# Patient Record
Sex: Female | Born: 1935 | Race: White | Hispanic: No | Smoking: Never smoker
Health system: Southern US, Community
[De-identification: ages and names within clinical notes are randomized; demographics above are authoritative.]

## PROBLEM LIST (undated history)

## (undated) DIAGNOSIS — R197 Diarrhea, unspecified: Secondary | ICD-10-CM

## (undated) DIAGNOSIS — M179 Osteoarthritis of knee, unspecified: Secondary | ICD-10-CM

## (undated) DIAGNOSIS — R519 Headache, unspecified: Secondary | ICD-10-CM

## (undated) DIAGNOSIS — D3500 Benign neoplasm of unspecified adrenal gland: Secondary | ICD-10-CM

## (undated) DIAGNOSIS — M1710 Unilateral primary osteoarthritis, unspecified knee: Secondary | ICD-10-CM

## (undated) DIAGNOSIS — Z87442 Personal history of urinary calculi: Secondary | ICD-10-CM

## (undated) DIAGNOSIS — F329 Major depressive disorder, single episode, unspecified: Secondary | ICD-10-CM

## (undated) DIAGNOSIS — M2540 Effusion, unspecified joint: Secondary | ICD-10-CM

## (undated) DIAGNOSIS — I10 Essential (primary) hypertension: Secondary | ICD-10-CM

## (undated) DIAGNOSIS — IMO0001 Reserved for inherently not codable concepts without codable children: Secondary | ICD-10-CM

## (undated) DIAGNOSIS — F32A Depression, unspecified: Secondary | ICD-10-CM

## (undated) DIAGNOSIS — E0500 Thyrotoxicosis with diffuse goiter without thyrotoxic crisis or storm: Secondary | ICD-10-CM

## (undated) DIAGNOSIS — E785 Hyperlipidemia, unspecified: Secondary | ICD-10-CM

## (undated) DIAGNOSIS — L409 Psoriasis, unspecified: Secondary | ICD-10-CM

## (undated) DIAGNOSIS — I447 Left bundle-branch block, unspecified: Secondary | ICD-10-CM

## (undated) DIAGNOSIS — Z8619 Personal history of other infectious and parasitic diseases: Secondary | ICD-10-CM

## (undated) DIAGNOSIS — R51 Headache: Secondary | ICD-10-CM

## (undated) DIAGNOSIS — H9190 Unspecified hearing loss, unspecified ear: Secondary | ICD-10-CM

## (undated) DIAGNOSIS — M2550 Pain in unspecified joint: Secondary | ICD-10-CM

## (undated) HISTORY — PX: OTHER SURGICAL HISTORY: SHX169

## (undated) HISTORY — PX: CARDIAC CATHETERIZATION: SHX172

## (undated) HISTORY — DX: Unilateral primary osteoarthritis, unspecified knee: M17.10

## (undated) HISTORY — DX: Major depressive disorder, single episode, unspecified: F32.9

## (undated) HISTORY — PX: CHOLECYSTECTOMY: SHX55

## (undated) HISTORY — PX: KNEE ARTHROSCOPY W/ MENISCAL REPAIR: SHX1877

## (undated) HISTORY — DX: Osteoarthritis of knee, unspecified: M17.9

## (undated) HISTORY — PX: CATARACT EXTRACTION: SUR2

## (undated) HISTORY — DX: Depression, unspecified: F32.A

## (undated) HISTORY — PX: REPLACEMENT TOTAL KNEE: SUR1224

## (undated) HISTORY — DX: Thyrotoxicosis with diffuse goiter without thyrotoxic crisis or storm: E05.00

## (undated) HISTORY — DX: Benign neoplasm of unspecified adrenal gland: D35.00

---

## 1998-04-29 ENCOUNTER — Other Ambulatory Visit: Admission: RE | Admit: 1998-04-29 | Discharge: 1998-04-29 | Payer: Self-pay | Admitting: Internal Medicine

## 1999-09-07 ENCOUNTER — Other Ambulatory Visit: Admission: RE | Admit: 1999-09-07 | Discharge: 1999-09-07 | Payer: Self-pay | Admitting: Internal Medicine

## 2000-03-12 ENCOUNTER — Encounter: Admission: RE | Admit: 2000-03-12 | Discharge: 2000-03-12 | Payer: Self-pay | Admitting: Internal Medicine

## 2000-03-12 ENCOUNTER — Encounter: Payer: Self-pay | Admitting: Internal Medicine

## 2000-11-01 ENCOUNTER — Encounter: Payer: Self-pay | Admitting: Internal Medicine

## 2000-11-01 ENCOUNTER — Encounter: Admission: RE | Admit: 2000-11-01 | Discharge: 2000-11-01 | Payer: Self-pay | Admitting: Internal Medicine

## 2002-01-03 ENCOUNTER — Inpatient Hospital Stay (HOSPITAL_COMMUNITY): Admission: EM | Admit: 2002-01-03 | Discharge: 2002-01-06 | Payer: Self-pay | Admitting: Emergency Medicine

## 2002-01-03 ENCOUNTER — Encounter: Payer: Self-pay | Admitting: Internal Medicine

## 2002-01-03 ENCOUNTER — Encounter (INDEPENDENT_AMBULATORY_CARE_PROVIDER_SITE_OTHER): Payer: Self-pay | Admitting: Specialist

## 2002-01-04 ENCOUNTER — Encounter: Payer: Self-pay | Admitting: Internal Medicine

## 2002-01-06 ENCOUNTER — Encounter: Payer: Self-pay | Admitting: Internal Medicine

## 2002-01-28 ENCOUNTER — Ambulatory Visit (HOSPITAL_COMMUNITY): Admission: RE | Admit: 2002-01-28 | Discharge: 2002-01-28 | Payer: Self-pay | Admitting: Internal Medicine

## 2002-01-28 ENCOUNTER — Encounter: Payer: Self-pay | Admitting: Internal Medicine

## 2002-02-28 ENCOUNTER — Encounter: Payer: Self-pay | Admitting: Internal Medicine

## 2002-02-28 ENCOUNTER — Ambulatory Visit (HOSPITAL_COMMUNITY): Admission: RE | Admit: 2002-02-28 | Discharge: 2002-02-28 | Payer: Self-pay | Admitting: Internal Medicine

## 2002-05-12 ENCOUNTER — Encounter: Payer: Self-pay | Admitting: Internal Medicine

## 2002-05-12 ENCOUNTER — Ambulatory Visit (HOSPITAL_COMMUNITY): Admission: RE | Admit: 2002-05-12 | Discharge: 2002-05-12 | Payer: Self-pay | Admitting: Internal Medicine

## 2002-06-05 ENCOUNTER — Ambulatory Visit (HOSPITAL_COMMUNITY): Admission: RE | Admit: 2002-06-05 | Discharge: 2002-06-05 | Payer: Self-pay | Admitting: Internal Medicine

## 2002-06-05 ENCOUNTER — Encounter: Payer: Self-pay | Admitting: Internal Medicine

## 2002-10-10 ENCOUNTER — Encounter: Admission: RE | Admit: 2002-10-10 | Discharge: 2002-10-10 | Payer: Self-pay | Admitting: Internal Medicine

## 2002-10-10 ENCOUNTER — Encounter: Payer: Self-pay | Admitting: Internal Medicine

## 2003-08-14 ENCOUNTER — Encounter: Payer: Self-pay | Admitting: Internal Medicine

## 2003-08-14 ENCOUNTER — Ambulatory Visit (HOSPITAL_COMMUNITY): Admission: RE | Admit: 2003-08-14 | Discharge: 2003-08-14 | Payer: Self-pay | Admitting: Internal Medicine

## 2003-10-02 ENCOUNTER — Encounter: Admission: RE | Admit: 2003-10-02 | Discharge: 2003-10-02 | Payer: Self-pay | Admitting: Internal Medicine

## 2003-11-16 ENCOUNTER — Encounter: Admission: RE | Admit: 2003-11-16 | Discharge: 2003-11-16 | Payer: Self-pay | Admitting: Internal Medicine

## 2004-07-29 ENCOUNTER — Ambulatory Visit (HOSPITAL_COMMUNITY): Admission: RE | Admit: 2004-07-29 | Discharge: 2004-07-29 | Payer: Self-pay | Admitting: Internal Medicine

## 2005-01-25 ENCOUNTER — Encounter: Admission: RE | Admit: 2005-01-25 | Discharge: 2005-01-25 | Payer: Self-pay | Admitting: Internal Medicine

## 2006-03-12 ENCOUNTER — Encounter: Admission: RE | Admit: 2006-03-12 | Discharge: 2006-03-12 | Payer: Self-pay | Admitting: Internal Medicine

## 2006-10-22 ENCOUNTER — Other Ambulatory Visit: Admission: RE | Admit: 2006-10-22 | Discharge: 2006-10-22 | Payer: Self-pay | Admitting: *Deleted

## 2006-10-25 ENCOUNTER — Encounter: Admission: RE | Admit: 2006-10-25 | Discharge: 2006-10-25 | Payer: Self-pay

## 2007-04-26 ENCOUNTER — Encounter: Admission: RE | Admit: 2007-04-26 | Discharge: 2007-04-26 | Payer: Self-pay | Admitting: *Deleted

## 2008-04-28 ENCOUNTER — Encounter: Admission: RE | Admit: 2008-04-28 | Discharge: 2008-04-28 | Payer: Self-pay | Admitting: Internal Medicine

## 2009-04-29 ENCOUNTER — Encounter: Admission: RE | Admit: 2009-04-29 | Discharge: 2009-04-29 | Payer: Self-pay | Admitting: Internal Medicine

## 2010-09-30 ENCOUNTER — Inpatient Hospital Stay (HOSPITAL_COMMUNITY): Admission: RE | Admit: 2010-09-30 | Discharge: 2010-10-04 | Payer: Self-pay | Admitting: Orthopedic Surgery

## 2011-02-08 LAB — CBC
MCHC: 34.1 g/dL (ref 30.0–36.0)
MCV: 91.2 fL (ref 78.0–100.0)
MCV: 93 fL (ref 78.0–100.0)
MCV: 93.3 fL (ref 78.0–100.0)
Platelets: 208 10*3/uL (ref 150–400)
Platelets: 211 10*3/uL (ref 150–400)
RBC: 4.45 MIL/uL (ref 3.87–5.11)
RDW: 13.4 % (ref 11.5–15.5)
RDW: 13.8 % (ref 11.5–15.5)
RDW: 13.8 % (ref 11.5–15.5)
WBC: 12.2 10*3/uL — ABNORMAL HIGH (ref 4.0–10.5)
WBC: 7.9 10*3/uL (ref 4.0–10.5)

## 2011-02-08 LAB — BASIC METABOLIC PANEL
BUN: 6 mg/dL (ref 6–23)
Calcium: 7.8 mg/dL — ABNORMAL LOW (ref 8.4–10.5)
Chloride: 94 mEq/L — ABNORMAL LOW (ref 96–112)
Chloride: 95 mEq/L — ABNORMAL LOW (ref 96–112)
Chloride: 97 mEq/L (ref 96–112)
Creatinine, Ser: 0.63 mg/dL (ref 0.4–1.2)
Creatinine, Ser: 0.75 mg/dL (ref 0.4–1.2)
GFR calc Af Amer: >60 mL/min (ref >60)
GFR calc non Af Amer: >60 mL/min (ref >60)
GFR calc non Af Amer: >60 mL/min (ref >60)
Glucose, Bld: 182 mg/dL — ABNORMAL HIGH (ref 70–99)
Potassium: 3.3 mEq/L — ABNORMAL LOW (ref 3.5–5.1)
Sodium: 130 mEq/L — ABNORMAL LOW (ref 135–145)
Sodium: 134 mEq/L — ABNORMAL LOW (ref 135–145)
Sodium: 136 mEq/L (ref 135–145)

## 2011-02-08 LAB — COMPREHENSIVE METABOLIC PANEL
ALT: 12 U/L (ref 0–35)
CO2: 31 mEq/L (ref 19–32)
Chloride: 99 mEq/L (ref 96–112)
Creatinine, Ser: 0.68 mg/dL (ref 0.4–1.2)
GFR calc Af Amer: >60 mL/min (ref >60)
Sodium: 138 mEq/L (ref 135–145)
Total Protein: 6.8 g/dL (ref 6.0–8.3)

## 2011-02-08 LAB — TYPE AND SCREEN
ABO/RH(D): A NEG
Antibody Screen: NEGATIVE

## 2011-02-08 LAB — URINE MICROSCOPIC-ADD ON

## 2011-02-08 LAB — DIFFERENTIAL
Basophils Relative: 0 % (ref 0–1)
Monocytes Relative: 8 % (ref 3–12)
Neutro Abs: 5.1 10*3/uL (ref 1.7–7.7)
Neutrophils Relative %: 64 % (ref 43–77)

## 2011-02-08 LAB — URINALYSIS, ROUTINE W REFLEX MICROSCOPIC
Bilirubin Urine: NEGATIVE
Glucose, UA: NEGATIVE mg/dL
Specific Gravity, Urine: 1.011 (ref 1.005–1.030)
Urobilinogen, UA: 0.2 mg/dL (ref 0.0–1.0)
pH: 7 (ref 5.0–8.0)

## 2011-02-08 LAB — PROTIME-INR
INR: 0.91 (ref 0.00–1.49)
INR: 1.05 (ref 0.00–1.49)
INR: 1.14 (ref 0.00–1.49)
Prothrombin Time: 12.5 seconds (ref 11.6–15.2)
Prothrombin Time: 13.9 seconds (ref 11.6–15.2)
Prothrombin Time: 14.8 seconds (ref 11.6–15.2)
Prothrombin Time: 15.3 seconds — ABNORMAL HIGH (ref 11.6–15.2)

## 2011-02-08 LAB — SURGICAL PCR SCREEN: MRSA, PCR: NEGATIVE

## 2011-02-08 LAB — APTT: aPTT: 28 seconds (ref 24–37)

## 2011-04-14 NOTE — Op Note (Signed)
[place]. [facility]  Patient:    Christina Green, Christina Green Visit Number: [id number] MRN: [id number]          Service Type: MED Location: [phone] Attending Physician:  Madison Hickman Dictated by:   Lorne Skeens. Hoxworth, M.D. Proc. Date: 01/04/02 Admit Date:  01/03/2002                             Operative Report  PREOPERATIVE DIAGNOSIS:  Acute cholecystitis and cholelithiasis.  POSTOPERATIVE DIAGNOSIS:  Acute cholecystitis and cholelithiasis.  PROCEDURE:  Laparoscopic cholecystectomy.  SURGEON:  Lorne Skeens. Hoxworth, M.D.  ASSISTANT:  Currie Paris, M.D.  ANESTHESIA:  General.  BRIEF HISTORY:  [name] is a 77 year old white female who presented approximately 36 hours ago with acute epigastric and back pain.  MI was ruled out, and today she had a CT scan that shows marked thickening and edema of the gallbladder.  She has epigastric and right upper quadrant tenderness.  LFTs were normal.  Laparoscopic cholecystectomy has been recommended and accepted. The procedure, its indications, the risks of bleeding, infection, and bile duct injury were discussed and understood preoperatively.  She is now brought to the operating room for this procedure.  DESCRIPTION OF PROCEDURE:  The patient was brought to the operating room and placed in the supine position on the operating table, and general endotracheal anesthesia was induced.  She was already on broad-spectrum antibiotics.  The abdomen was sterilely prepped and draped.  PAS were in place.  Local anesthesia was used to infiltrate the trocar sites.  A 1 cm incision was made at the umbilicus and dissection carried down to the midline fascia, which was sharply incised for 1 cm and the peritoneum entered under direct vision.  A 2-0 mattress suture of 0 Vicryl, and the Hasson trocar was placed with pneumoperitoneum established.  There was a 1.5 cm exophytic skin lesion at the exact site of the  planned epigastric trocar.  This lesion was excised elliptically and trocar placed under direct vision in the epigastric area and two 5 mm trocars along the right subcostal margin.  There were obvious acute inflammatory changes in the right upper quadrant.  The omentum was adherent to the anterior abdominal wall with inflammatory adhesions, which were taken down bluntly.  The omentum was peeled back off the gallbladder, which was acutely inflamed with exudate.  The fundus was grasped.  The gallbladder was very friable and was opened at this point.  Gallstones and bile were suctioned clear.  Using blunt dissection, the infundibulum was exposed and was able to be retracted inferolaterally.  The distal gallbladder was very edematous.  The dissection was accomplished mostly with blunt dissection as the distal gallbladder was thoroughly dissected.  The cystic artery was seen coursing up onto the gallbladder wall, and it was divided between two proximal and one distal clip.  Then using blunt dissection, the distal gallbladder was dissected 360 degrees and seen to taper down to the cystic duct.  When the anatomy was clear, the cystic duct was doubly clipped proximally and clipped distally and divided.  The gallbladder was then dissected free from its bed using hook and spatula cautery and was placed in an Endocatch bag and brought out through the umbilical port.  The right upper quadrant was thoroughly irrigated.  Multiple gallstones were removed from Morisons pouch.  Hemostasis was obtained in the gallbladder bed with cautery and a Surgicel pack.  Two liters of irrigation were used until it was completely clear.  The site was inspected for hemostasis, which appeared complete.  A closed-suction drain was brought out through one of the lateral trocar sites and left in Morisons pouch.  The trocars were removed under direct vision and the pursestring suture secured at the umbilicus.  An additional  fascial suture was placed at the epigastric site.  Skin incisions were closed with staples.  Sponge, needle, and instrument counts were correct.  Dry sterile dressings were applied and the patient taken to recovery in good condition. Dictated by:   Lorne Skeens. Hoxworth, M.D. Attending Physician:  Madison Hickman DD:  01/04/02 TD:  01/06/02 Job: [id number]

## 2011-04-14 NOTE — Discharge Summary (Signed)
[place]. [facility]  Patient:    Christina Green, Christina Green Visit Number: [id number] MRN: [id number]          Service Type: MED Location: 5500 [phone] Attending Physician:  Madison Hickman Dictated by:   Darius Bump, M.D. Admit Date:  01/03/2002 Discharge Date: 01/06/2002   CC:         Christina Green, M.D.   Discharge Summary  ADMISSION DIAGNOSES: 1. Abdominal pain, uncertain etiology, with leukocytosis. 2. Hypertension. 3. Hyperlipidemia. 4. Osteopenia.  DISCHARGE DIAGNOSES: 1. Acute cholecystitis. 2. 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 3. Left ovarian cyst. 4. Hypertension. 5. Hyperlipidemia.  DISCHARGE MEDICATIONS: 1. Zocor 20 mg q.d. 2. Trandate 100 mg q.a.m. 200 mg q.h.s. 3. Ogen 0.625 mg q.d. 4. Provera 2.5 mg p.o. q.d. 5. Vicodin p.r.n. pain.  CONDITION ON DISCHARGE:  Improved.  HISTORY OF PRESENT ILLNESS:  Christina Green is an extremely pleasant 77 year old woman who was admitted with a 12 hour history of abdominal pain in the periumbilical and right upper quadrant area associated with nausea and vomiting.  She had not noted that she had any fever, possibly had some chills. She never had similar pain.  PAST MEDICAL HISTORY: 1. Hypertension. 2. Hyperlipidemia.  ADMISSION MEDICATIONS: 1. Zocor. 2. Trandate. 3. Ogen. 4. Provera.  ALLERGIES:  PENICILLIN.  ADMISSION PHYSICAL EXAMINATION:  GENERAL:  An ill-appearing woman whose temperature was 97, blood pressure 164/84, heart rate 72, with mild orthostatic changes.  Oxygen saturation was 98%.  HEENT:  Unremarkable.  NECK:  Unremarkable.  LUNGS:  Unremarkable.  HEART:  Unremarkable.  ABDOMEN:  Normal bowel sounds.  There is moderate epigastric and right upper quadrant tenderness without rebound or guarding.  NEUROLOGIC:  Nonfocal.  ADMISSION LABORATORY DATA:  White blood cell count 19.9, 92% polys, hemoglobin 14.2.  Normal liver  function tests, amylase, and lipase.  Normal electrolytes.  EKG had evidence of lateral strain which was unchanged from previous EKGs.  HOSPITAL COURSE:  The patient underwent a three-way of the abdomen on admission to the emergency room which was remarkable only for constipation. However, due to continued symptoms, she did undergo a CT which showed right upper quadrant inflammatory process with thickening of the gallbladder wall, raising the question of cholecystitis.  In addition, an adrenal mass and an ovarian cyst were found incidentally.  Dr. Johna Sheriff was consulted, and the patient underwent a laparoscopic cholecystectomy on 01/04/02.  She received two doses of Cipro perioperatively.  In terms of laboratories, her white count was reduced to 9.5 at the time of discharge.  Hemoglobin was at 10.5.  CMET remained normal.  Blood cultures were negative.  CONDITION ON DISCHARGE:  Improved.  The patient will require further evaluation of her incidental adrenal mass and ovarian cyst as an outpatient.  MRI was considered during hospitalization, but needed to be delayed due to staples present postoperatively. Dictated by:   Darius Bump, M.D. Attending Physician:  Madison Hickman DD:  01/22/02 TD:  01/22/02 Job: [id number]

## 2011-04-14 NOTE — H&P (Signed)
[place]. [facility]  Patient:    Christina Green, Christina Green Visit Number: [id number] MRN: [id number]          Service Type: MED Location: [phone] Attending Physician:  Madison Hickman Dictated by:   Darius Bump, M.D. Admit Date:  01/03/2002 Discharge Date: 01/06/2002                           History and Physical  CHIEF COMPLAINT:  Mrs. Basto is a 77 year old woman being admitted with epigastric pain, leukocytosis.  HISTORY OF PRESENT ILLNESS:  She is a very pleasant 77 year old woman who, last evening at rest, had acute onset of severe epigastric pain radiating to her back associated with nausea and vomiting.  She vomited six to eight times last night and is having continuing pain.  She has not had any diarrhea.  Last bowel movement was the day before yesterday.  She has not noted fever, may have had chills.  Feels a little short of breath when the pain is severe and maybe some slight pleuritic pain or pain with deep breath.  Pain is possibly improved with walking and also relieved with vomiting.  She has not had any hematemesis.  She describes the pain as sharp, excruciating, and sometimes feeling like pressure.  She has not had similar pain in the past.  PAST MEDICAL HISTORY: 1. Hypertension. 2. Hyperlipidemia. 3. Nephrolithiasis. 4. Onychomycosis. 5. Osteopenia. 6. Ventral hernia. 7. Atypical chest pain status post catheterization in 1995 that showed    nonspecific disease after a false positive stress test.  PAST SURGICAL HISTORY:  Knee surgery in 1996.  MEDICATIONS: 1. Zocor 20 mg q.d. 2. Trandate 100 mg in the morning, 200 mg q.h.s. 3. Ogen 0.625. 4. Provera 2.5.  ALLERGIES:  PENICILLIN - develops rash and swelling, apparently occurred in childhood.  FAMILY HISTORY:  Father with hypertension and CAD, mother with hypertension and CAD, sister with hypertension and recent cholecystitis.  SOCIAL HISTORY:  She is married.   Husband has Alzheimers disease.  Daughter and grandchildren live next door, are very involved.  No tobacco, rare alcohol.  REVIEW OF SYSTEMS:  As per HPI.  PHYSICAL EXAMINATION:  GENERAL:  An ill-appearing woman, no acute distress.  VITAL SIGNS:  Weight 127.  Temperature 97.  Blood pressure 164/84 with a heart rate of 72 lying down; 142/72 with a heart rate of 90 standing.  Saturation 98% on room air.  HEENT:  Pupils are equal, round, reactive to light.  Extraocular muscles are intact.  Oropharynx is clear.  NECK:  Without JVD.  Has symmetric pulses, no bruits heard.  LUNGS:  Clear.  HEART:  Regular rhythm and rate.  No murmur heard.  ABDOMEN:  Soft with normal bowel sounds.  She has moderate epigastric and right upper quadrant tenderness that is reproducible.  There is no rebound present.  She has no tenderness over her ventral hernia.  No lower quadrant tenderness.  RECTAL:  Deferred.  NEUROLOGIC:  Nonfocal.  LABORATORY DATA:  White count 19.9 with 92% polys, hemoglobin 14.2, platelet count 270.  LFTs reveal amylase and lipase are normal.  Sodium 139, potassium 3.5, chloride 100, bicarb 25, BUN 18, creatinine 0.8, glucose 125.  EKG shows normal sinus rhythm with left axis deviation and there is some slight ST depression in the lateral leads consistent with strain that had been seen on previous EKGs.  ASSESSMENT:  A 77 year old woman, new onset of severe abdominal  pain now ongoing for 14 hours, with leukocytosis of unclear etiology.  Initially by presentation favored cholecystitis but LFTs, amylase, and lipase are normal. Lung exam is normal.  Question whether she could have a partial small bowel obstruction.  PLAN:  Admit.  We are going to hydrate her.  Start with a three-way of the abdomen and probably proceed with an abdominal CT.  She is to be made n.p.o. We will obtain blood cultures.  I am not going to give her antibiotics until above studies are done.  She is  agreeable with plan.  We will place her on Lopressor IV for blood pressure as she is usually on a beta blocker and she will be n.p.o. Dictated by:   Darius Bump, M.D. Attending Physician:  Madison Hickman DD:  01/03/02 TD:  01/04/02 Job: [id number]

## 2012-09-26 ENCOUNTER — Other Ambulatory Visit (HOSPITAL_COMMUNITY): Payer: Self-pay | Admitting: Diagnostic Radiology

## 2012-09-26 DIAGNOSIS — N281 Cyst of kidney, acquired: Secondary | ICD-10-CM

## 2012-10-07 ENCOUNTER — Other Ambulatory Visit: Payer: Self-pay | Admitting: Internal Medicine

## 2012-10-07 ENCOUNTER — Ambulatory Visit
Admission: RE | Admit: 2012-10-07 | Discharge: 2012-10-07 | Disposition: A | Discharge status: Home / Self Care | Payer: 59 | Source: Ambulatory Visit | Attending: Internal Medicine | Admitting: Internal Medicine

## 2012-10-07 DIAGNOSIS — S0990XA Unspecified injury of head, initial encounter: Secondary | ICD-10-CM

## 2012-10-11 ENCOUNTER — Ambulatory Visit (HOSPITAL_COMMUNITY)
Admission: RE | Admit: 2012-10-11 | Discharge: 2012-10-11 | Disposition: A | Discharge status: Home / Self Care | Payer: Medicare Other | Source: Ambulatory Visit | Attending: Diagnostic Radiology | Admitting: Diagnostic Radiology

## 2012-10-11 DIAGNOSIS — N281 Cyst of kidney, acquired: Secondary | ICD-10-CM

## 2012-10-11 DIAGNOSIS — Q619 Cystic kidney disease, unspecified: Secondary | ICD-10-CM | POA: Insufficient documentation

## 2012-10-11 MED ORDER — GADOBENATE DIMEGLUMINE 529 MG/ML IV SOLN
12.0000 mL | Freq: Once | INTRAVENOUS | Status: AC | PRN
  Administered 2012-10-11: 12 mL via INTRAVENOUS

## 2012-10-12 ENCOUNTER — Ambulatory Visit (HOSPITAL_COMMUNITY): Admission: RE | Admit: 2012-10-12 | Payer: 59 | Source: Ambulatory Visit

## 2012-10-14 LAB — POCT I-STAT, CHEM 8
Calcium, Ion: 1.25 mmol/L (ref 1.13–1.30)
Chloride: 102 mEq/L (ref 96–112)
Glucose, Bld: 93 mg/dL (ref 70–99)
HCT: 38 % (ref 36.0–46.0)
Hemoglobin: 12.9 g/dL (ref 12.0–15.0)
TCO2: 26 mmol/L (ref 0–100)

## 2013-06-25 ENCOUNTER — Emergency Department (HOSPITAL_COMMUNITY): Payer: Medicare Other

## 2013-06-25 ENCOUNTER — Emergency Department (HOSPITAL_COMMUNITY)
Admission: EM | Admit: 2013-06-25 | Discharge: 2013-06-25 | Disposition: A | Discharge status: Home / Self Care | Payer: Medicare Other | Attending: Emergency Medicine | Admitting: Emergency Medicine

## 2013-06-25 ENCOUNTER — Encounter (HOSPITAL_COMMUNITY): Payer: Self-pay | Admitting: Radiology

## 2013-06-25 DIAGNOSIS — Z8679 Personal history of other diseases of the circulatory system: Secondary | ICD-10-CM | POA: Insufficient documentation

## 2013-06-25 DIAGNOSIS — I10 Essential (primary) hypertension: Secondary | ICD-10-CM | POA: Insufficient documentation

## 2013-06-25 DIAGNOSIS — M542 Cervicalgia: Secondary | ICD-10-CM | POA: Insufficient documentation

## 2013-06-25 DIAGNOSIS — E785 Hyperlipidemia, unspecified: Secondary | ICD-10-CM | POA: Insufficient documentation

## 2013-06-25 DIAGNOSIS — R42 Dizziness and giddiness: Secondary | ICD-10-CM | POA: Insufficient documentation

## 2013-06-25 DIAGNOSIS — Z79899 Other long term (current) drug therapy: Secondary | ICD-10-CM | POA: Insufficient documentation

## 2013-06-25 HISTORY — DX: Headache, unspecified: R51.9

## 2013-06-25 HISTORY — DX: Hyperlipidemia, unspecified: E78.5

## 2013-06-25 HISTORY — DX: Headache: R51

## 2013-06-25 HISTORY — DX: Essential (primary) hypertension: I10

## 2013-06-25 LAB — URINE MICROSCOPIC-ADD ON

## 2013-06-25 LAB — COMPREHENSIVE METABOLIC PANEL
ALT: 9 U/L (ref 0–35)
AST: 17 U/L (ref 0–37)
Albumin: 3.7 g/dL (ref 3.5–5.2)
Alkaline Phosphatase: 78 U/L (ref 39–117)
BUN: 15 mg/dL (ref 6–23)
CO2: 25 mEq/L (ref 19–32)
Calcium: 9.9 mg/dL (ref 8.4–10.5)
Chloride: 100 mEq/L (ref 96–112)
Creatinine, Ser: 0.66 mg/dL (ref 0.50–1.10)
GFR calc Af Amer: >90 mL/min (ref >90)
GFR calc non Af Amer: 79 mL/min — ABNORMAL LOW (ref >90)
Glucose, Bld: 136 mg/dL — ABNORMAL HIGH (ref 70–99)
Potassium: 4 mEq/L (ref 3.5–5.1)
Sodium: 136 mEq/L (ref 135–145)
Total Bilirubin: 0.5 mg/dL (ref 0.3–1.2)
Total Protein: 6.6 g/dL (ref 6.0–8.3)

## 2013-06-25 LAB — URINALYSIS, ROUTINE W REFLEX MICROSCOPIC
Glucose, UA: NEGATIVE mg/dL
Ketones, ur: NEGATIVE mg/dL
Protein, ur: NEGATIVE mg/dL
pH: 7 (ref 5.0–8.0)

## 2013-06-25 LAB — CBC
MCHC: 34.1 g/dL (ref 30.0–36.0)
Platelets: 240 10*3/uL (ref 150–400)
RDW: 13.8 % (ref 11.5–15.5)
WBC: 6.6 10*3/uL (ref 4.0–10.5)

## 2013-06-25 MED ORDER — MECLIZINE HCL 25 MG PO TABS
12.5000 mg | ORAL_TABLET | Freq: Once | ORAL | Status: AC
  Administered 2013-06-25: 12.5 mg via ORAL
  Filled 2013-06-25: qty 1

## 2013-06-25 MED ORDER — MECLIZINE HCL 12.5 MG PO TABS: 25.0000 mg | ORAL_TABLET | Freq: Three times a day (TID) | ORAL | Status: DC | PRN

## 2013-06-25 MED ORDER — ACETAMINOPHEN 325 MG PO TABS
650.0000 mg | ORAL_TABLET | Freq: Once | ORAL | Status: AC
  Administered 2013-06-25: 650 mg via ORAL
  Filled 2013-06-25: qty 2

## 2013-06-25 MED ORDER — TRAMADOL HCL 50 MG PO TABS: 50.0000 mg | ORAL_TABLET | Freq: Three times a day (TID) | ORAL | Status: DC | PRN

## 2013-06-25 NOTE — ED Provider Notes (Signed)
CSN: [id number]     Arrival date & time 06/25/13  0758 History     First MD Initiated Contact with Patient 06/25/13 [phone]     Chief Complaint  Patient presents with  . Dizziness   (Consider location/radiation/quality/duration/timing/severity/associated sxs/prior Treatment) The history is provided by the patient.  pt w hx htn, c/o feeling dizzy, room spinning sensation in past day. Gradual onset, constant. Mild-mod. Denies specific exacerbating or alleviating factors. No change in speech or vision. No numbness/weakness. No problems w balance or coordination. Notes hx dizziness/lightheadedness upon standing for past year, but states this feels different. Also notes hx intermittent headaches, but denies any acute or abrupt change in headaches or severe head pain. No sinus congestion or allergy symptoms. No hearing loss or tinnitus. No injury/trauma. Denies recent change in meds or new meds. No fever or chills.   Past Medical History  Diagnosis Date  . Hypertension   . Hyperlipemia   . Headache    Past Surgical History  Procedure Laterality Date  . Cholecystectomy    . Cataract extraction    . Replacement total knee     History reviewed. No pertinent family history. History  Substance Use Topics  . Smoking status: Not on file  . Smokeless tobacco: Not on file  . Alcohol Use: Not on file   OB History   Grav Para Term Preterm Abortions TAB SAB Ect Mult Living                 Review of Systems  Constitutional: Negative for fever and chills.  HENT: Negative for neck pain.   Eyes: Negative for redness.  Respiratory: Negative for cough and shortness of breath.   Cardiovascular: Negative for chest pain and palpitations.  Gastrointestinal: Negative for abdominal pain.  Genitourinary: Negative for flank pain.  Musculoskeletal: Negative for back pain.  Skin: Negative for rash.  Neurological: Positive for dizziness.  Hematological: Does not bruise/bleed easily.   Psychiatric/Behavioral: Negative for confusion.    Allergies  Penicillins  Home Medications  No current outpatient prescriptions on file. BP 203/88  Pulse 54  Temp(Src) 97.8 F (36.6 C) (Oral)  Resp 18  Ht 4\' 11"  (1.499 m)  Wt 130 lb (58.968 kg)  BMI 26.24 kg/m2  SpO2 93% Physical Exam  Nursing note and vitals reviewed. Constitutional: She is oriented to person, place, and time. She appears well-developed and well-nourished. No distress.  HENT:  Head: Atraumatic.  tms normal.   Eyes: Conjunctivae and EOM are normal. Pupils are equal, round, and reactive to light. No scleral icterus.  Neck: Neck supple. No tracheal deviation present.  No bruit  Cardiovascular: Normal rate, regular rhythm, normal heart sounds and intact distal pulses.   Pulmonary/Chest: Effort normal and breath sounds normal. No respiratory distress.  Abdominal: Soft. Normal appearance. She exhibits no distension. There is no tenderness.  Genitourinary:  No cva tenderness  Musculoskeletal: She exhibits no edema and no tenderness.  Neurological: She is alert and oriented to person, place, and time. No cranial nerve deficit.  Romberg unremarkable. Steady gait   Skin: Skin is warm and dry. No rash noted.  Psychiatric: She has a normal mood and affect.    ED Course   Procedures (including critical care time)  Results for orders placed during the hospital encounter of 06/25/13  CBC      Result Value Range   WBC 6.6  4.0 - 10.5 K/uL   RBC 4.46  3.87 - 5.11 MIL/uL   Hemoglobin 13.6  12.0 - 15.0 g/dL   HCT 40.9  81.1 - 91.4 %   MCV 89.5  78.0 - 100.0 fL   MCH 30.5  26.0 - 34.0 pg   MCHC 34.1  30.0 - 36.0 g/dL   RDW 78.2  95.6 - 21.3 %   Platelets 240  150 - 400 K/uL  COMPREHENSIVE METABOLIC PANEL      Result Value Range   Sodium 136  135 - 145 mEq/L   Potassium 4.0  3.5 - 5.1 mEq/L   Chloride 100  96 - 112 mEq/L   CO2 25  19 - 32 mEq/L   Glucose, Bld 136 (*) 70 - 99 mg/dL   BUN 15  6 - 23 mg/dL    Creatinine, Ser 0.86  0.50 - 1.10 mg/dL   Calcium 9.9  8.4 - 57.8 mg/dL   Total Protein 6.6  6.0 - 8.3 g/dL   Albumin 3.7  3.5 - 5.2 g/dL   AST 17  0 - 37 U/L   ALT 9  0 - 35 U/L   Alkaline Phosphatase 78  39 - 117 U/L   Total Bilirubin 0.5  0.3 - 1.2 mg/dL   GFR calc non Af Amer 79 (*) >90 mL/min   GFR calc Af Amer >90  >90 mL/min  URINALYSIS, ROUTINE W REFLEX MICROSCOPIC      Result Value Range   Color, Urine YELLOW  YELLOW   APPearance CLEAR  CLEAR   Specific Gravity, Urine 1.013  1.005 - 1.030   pH 7.0  5.0 - 8.0   Glucose, UA NEGATIVE  NEGATIVE mg/dL   Hgb urine dipstick SMALL (*) NEGATIVE   Bilirubin Urine NEGATIVE  NEGATIVE   Ketones, ur NEGATIVE  NEGATIVE mg/dL   Protein, ur NEGATIVE  NEGATIVE mg/dL   Urobilinogen, UA 0.2  0.0 - 1.0 mg/dL   Nitrite NEGATIVE  NEGATIVE   Leukocytes, UA SMALL (*) NEGATIVE  URINE MICROSCOPIC-ADD ON      Result Value Range   Squamous Epithelial / LPF MANY (*) RARE   WBC, UA 3-6  <3 WBC/hpf   RBC / HPF 3-6  <3 RBC/hpf   Bacteria, UA RARE  RARE   Ct Head Wo Contrast  06/25/2013   *RADIOLOGY REPORT*  Clinical Data: Dizziness, pain, head injury 8 months ago  CT HEAD WITHOUT CONTRAST  Technique:  Contiguous axial images were obtained from the base of the skull through the vertex without contrast.  Comparison:   10/07/2012  Findings: No skull fracture is noted. Paranasal sinuses and mastoid air cells are unremarkable.  No intracranial hemorrhage, mass effect or midline shift.  Stable cerebral atrophy.  Periventricular and patchy subcortical white matter decreased attenuation consistent with chronic small vessel ischemic changes stable from prior exam.  Atherosclerotic calcifications of carotid siphon are again noted. Atherosclerotic calcifications of the vertebral and basilar artery. No acute cortical infarction.  No mass lesion is noted on this unenhanced scan.  IMPRESSION: No acute intracranial abnormality.  Stable atrophy and chronic white matter  disease.  No definite acute cortical infarction.   Original Report Authenticated By: Natasha Mead, M.D.   Ct Cervical Spine Wo Contrast  06/25/2013   *RADIOLOGY REPORT*  Clinical Data: Pain, recent fall  CT CERVICAL SPINE WITHOUT CONTRAST  Technique:  Multidetector CT imaging of the cervical spine was performed. Multiplanar CT image reconstructions were also generated.  Comparison: None.  Findings: Axial images of the cervical spine shows no acute fracture or subluxation.  There is no  pneumothorax in visualized lung apices.  No prevertebral soft tissue swelling.  Cervical airway is patent.  Degenerative changes are noted C1-C2 articulation.  Computer processed images shows no acute fracture.  There is mild disc space flattening at C2-C3 level.  There is disc space flattening with anterior and posterior spurring at C3-C4 level.  There is mild narrowing of the left lateral recess due to posterior spurring at C3-C4 level.  There is disc space flattening with mild anterior and mild to moderate posterior spurring and endplate sclerotic changes at C4-C5 level.  Moderate spinal canal stenosis due to posterior spurring at this level.  There is disc space flattening with anterior and posterior spurring at C5-C6 level.  Moderate spinal canal stenosis due to posterior spurring at this level.  There is significant disc space flattening with mild anterior and mild posterior spurring.  Mild spinal canal stenosis due to posterior spurring at this level.  There is about 2 mm anterolisthesis C7 on T1 vertebral body probable chronic in nature.  IMPRESSION: No acute fracture is noted.  Multilevel degenerative changes as described above.  Multilevel spinal canal stenosis due to posterior spurring as described above.  Probable chronic mild anterolisthesis C7 on T1 vertebral body.  No prevertebral soft tissue swelling. Cervical airway is patent.   Original Report Authenticated By: Natasha Mead, M.D.      MDM  antivert po.  Labs.  Reviewed nursing notes and prior charts for additional history.    Date: 06/25/2013  Rate: 54  Rhythm: sinus bradycardia  QRS Axis: left  Intervals: normal  ST/T Wave abnormalities: nonspecific ST/T changes  Conduction Disutrbances:left bundle branch block  Narrative Interpretation:   Old EKG Reviewed: none available   Pt much improved w antivert.   Ambulatory. No faintness or dizziness.   Pt requests ct neck as has had problems w arthritis, neck pain that have been getting worse. Ct done. Deg changes noted, neg acute. Will have f/u pcp.  Discussed need close pcp follow up re relative bradycardia, long standing lightheadedness when standing, dizziness, chronic neck pain/deg dis.   Pt appears stable for d/c from ed.     Suzi Roots, MD 06/25/13 1329

## 2013-06-25 NOTE — ED Notes (Signed)
Family at bedside. 

## 2013-06-25 NOTE — ED Notes (Signed)
Hooked pt back up to the monitor after returning from CT scan. 

## 2013-06-25 NOTE — ED Notes (Signed)
Ambulate pt. In the hallway .assit pt.back in the room and back to bed with nurse assitance. Pt.states she was very dizzie.

## 2013-06-25 NOTE — ED Notes (Signed)
Pt has been seen at [facility] for s/s. Pt states that today's dizziness is different. Condition is chronic in nature. Condition is made worse by "using my eyes" condition is made better by "itself"

## 2013-06-25 NOTE — ED Notes (Signed)
MD at bedside. 

## 2013-06-25 NOTE — ED Notes (Signed)
Unhooked pt to go to CT scan

## 2013-06-25 NOTE — ED Notes (Signed)
Patient transported to CT 

## 2013-06-25 NOTE — ED Notes (Signed)
Pt presents with persistent dizziness since 0645 this morning.

## 2014-04-22 ENCOUNTER — Encounter (HOSPITAL_COMMUNITY): Payer: Self-pay | Admitting: Emergency Medicine

## 2014-04-22 ENCOUNTER — Emergency Department (INDEPENDENT_AMBULATORY_CARE_PROVIDER_SITE_OTHER)
Admission: EM | Admit: 2014-04-22 | Discharge: 2014-04-22 | Disposition: A | Discharge status: Home / Self Care | Payer: 59 | Source: Home / Self Care | Attending: Family Medicine | Admitting: Family Medicine

## 2014-04-22 DIAGNOSIS — Y93H2 Activity, gardening and landscaping: Secondary | ICD-10-CM

## 2014-04-22 DIAGNOSIS — R296 Repeated falls: Secondary | ICD-10-CM

## 2014-04-22 DIAGNOSIS — IMO0002 Reserved for concepts with insufficient information to code with codable children: Secondary | ICD-10-CM

## 2014-04-22 DIAGNOSIS — S0081XA Abrasion of other part of head, initial encounter: Secondary | ICD-10-CM

## 2014-04-22 MED ORDER — TETANUS-DIPHTH-ACELL PERTUSSIS 5-2.5-18.5 LF-MCG/0.5 IM SUSP
INTRAMUSCULAR | Status: AC
  Filled 2014-04-22: qty 0.5

## 2014-04-22 MED ORDER — TETANUS-DIPHTH-ACELL PERTUSSIS 5-2.5-18.5 LF-MCG/0.5 IM SUSP
0.5000 mL | Freq: Once | INTRAMUSCULAR | Status: AC
  Administered 2014-04-22: 0.5 mL via INTRAMUSCULAR

## 2014-04-22 NOTE — ED Notes (Signed)
Pt  Reports  He  [address]    Today  Striking  Her  Head   She  Did  Not black out  She  Remembers  Everything          She  Is  Awake  And  Alert  And  Oriented  Her  Skin is  Warm  And  Dry  She   denys  Any  Vomiting      She  Has  An abrasion to  Her  Forehead

## 2014-04-22 NOTE — ED Provider Notes (Signed)
CSN: [id number]     Arrival date & time 04/22/14  1706 History   First MD Initiated Contact with Patient 04/22/14 1758     Chief Complaint  Patient presents with  . Fall   (Consider location/radiation/quality/duration/timing/severity/associated sxs/prior Treatment) Patient is a 80 y.o. female presenting with fall. The history is provided by the patient. No language interpreter was used.  Fall This is a new problem. The current episode started 1 to 2 hours ago. The problem occurs constantly. The problem has not changed since onset.Pertinent negatives include no headaches and no shortness of breath. Nothing aggravates the symptoms. Nothing relieves the symptoms. She has tried nothing for the symptoms. The treatment provided no relief.   Pt reports she fell while working in garden.  Pt scraped forehead on table as she fell.  Pt reports head did not hit ground.  No loss of consciousness.   Pt denies any neurologic sign. Pt reports hands feel sore.  Pt reports she hit her knee.  Pt does not think she broke anything.   Pt able to walk without difficulty Past Medical History  Diagnosis Date  . Hypertension   . Hyperlipemia   . Headache    Past Surgical History  Procedure Laterality Date  . Cholecystectomy    . Cataract extraction    . Replacement total knee     History reviewed. No pertinent family history. History  Substance Use Topics  . Smoking status: Not on file  . Smokeless tobacco: Not on file  . Alcohol Use: Not on file   OB History   Grav Para Term Preterm Abortions TAB SAB Ect Mult Living                 Review of Systems  Respiratory: Negative for shortness of breath.   Neurological: Negative for headaches.  All other systems reviewed and are negative.   Allergies  Penicillins  Home Medications   Prior to Admission medications   Medication Sig Start Date End Date Taking? Authorizing Provider  DULoxetine HCl (CYMBALTA PO) Take 1 tablet by mouth every morning.     Historical Provider, MD  ibuprofen (ADVIL,MOTRIN) 200 MG tablet Take 200 mg by mouth daily as needed for pain or headache.    Historical Provider, MD  losartan-hydrochlorothiazide (HYZAAR) 50-12.5 MG per tablet Take 1 tablet by mouth every morning.    Historical Provider, MD  meclizine (ANTIVERT) 12.5 MG tablet Take 2 tablets (25 mg total) by mouth 3 (three) times daily as needed for dizziness. 06/25/13   Mirna Mires, MD  Omega-3 Fatty Acids (FISH OIL PO) Take 1 capsule by mouth daily.    Historical Provider, MD  PRESCRIPTION MEDICATION Take 2 tablets by mouth 2 (two) times daily. Blood pressure medication    Historical Provider, MD  PRESCRIPTION MEDICATION Take 1 tablet by mouth daily. Cholesterol medication    Historical Provider, MD  traMADol (ULTRAM) 50 MG tablet Take 1 tablet (50 mg total) by mouth every 8 (eight) hours as needed for pain. 06/25/13   Mirna Mires, MD   BP 185/85  Pulse 62  Temp(Src) 98.3 F (36.8 C) (Oral)  Resp 18  SpO2 95% Physical Exam  Constitutional: She appears well-developed and well-nourished.  HENT:  Head: Normocephalic.  Right Ear: External ear normal.  Left Ear: External ear normal.  Nose: Nose normal.  Mouth/Throat: Oropharynx is clear and moist.  Abrasion forehead,  Small area of bruising forehead  Eyes: Pupils are equal, round, and reactive to  light.  Neck: Normal range of motion.  Cardiovascular: Normal rate.   Pulmonary/Chest: Effort normal.  Abdominal: Soft.  Musculoskeletal: She exhibits tenderness (bruised left knee  from,  from wisst and arms. ).  Neurological: She is alert.  Skin: Skin is warm.  Psychiatric: She has a normal mood and affect.    ED Course  Procedures (including critical care time) Labs Review Labs Reviewed - No data to display  Imaging Review No results found.   MDM   1. Abrasion of forehead    I doubt head injury,  Mechanism sounds more like an abrasion from table as she fell.  Pt does not feel she has  anything broken,  xrays declined.   I discussed neurologic symptoms with family that wound indicate that pt needs a ct.   Pt agrees to return f any changes.   I advised she can return for xray if any concerns    Fransico Meadow, Hershal Coria 04/22/14 1830   Medical screening examination/treatment/procedure(s) were performed by a resident physician or non-physician practitioner and as the supervising physician I was immediately available for consultation/collaboration.  Lynne Leader, MD    Gregor Hams, MD 04/23/14 [phone]

## 2014-04-22 NOTE — Discharge Instructions (Signed)
Abrasion An abrasion is a cut or scrape of the skin. Abrasions do not extend through all layers of the skin and most heal within 10 days. It is important to care for your abrasion properly to prevent infection. CAUSES  Most abrasions are caused by falling on, or gliding across, the ground or other surface. When your skin rubs on something, the outer and inner layer of skin rubs off, causing an abrasion. DIAGNOSIS  Your caregiver will be able to diagnose an abrasion during a physical exam.  TREATMENT  Your treatment depends on how large and deep the abrasion is. Generally, your abrasion will be cleaned with water and a mild soap to remove any dirt or debris. An antibiotic ointment may be put over the abrasion to prevent an infection. A bandage (dressing) may be wrapped around the abrasion to keep it from getting dirty.  You may need a tetanus shot if:  You cannot remember when you had your last tetanus shot.  You have never had a tetanus shot.  The injury broke your skin. If you get a tetanus shot, your arm may swell, get red, and feel warm to the touch. This is common and not a problem. If you need a tetanus shot and you choose not to have one, there is a rare chance of getting tetanus. Sickness from tetanus can be serious.  HOME CARE INSTRUCTIONS   If a dressing was applied, change it at least once a day or as directed by your caregiver. If the bandage sticks, soak it off with warm water.   Wash the area with water and a mild soap to remove all the ointment 2 times a day. Rinse off the soap and pat the area dry with a clean towel.   Reapply any ointment as directed by your caregiver. This will help prevent infection and keep the bandage from sticking. Use gauze over the wound and under the dressing to help keep the bandage from sticking.   Change your dressing right away if it becomes wet or dirty.   Only take over-the-counter or prescription medicines for pain, discomfort, or fever as  directed by your caregiver.   Follow up with your caregiver within 24 48 hours for a wound check, or as directed. If you were not given a wound-check appointment, look closely at your abrasion for redness, swelling, or pus. These are signs of infection. SEEK IMMEDIATE MEDICAL CARE IF:   You have increasing pain in the wound.   You have redness, swelling, or tenderness around the wound.   You have pus coming from the wound.   You have a fever or persistent symptoms for more than 2 3 days.  You have a fever and your symptoms suddenly get worse.  You have a bad smell coming from the wound or dressing.  MAKE SURE YOU:   Understand these instructions.  Will watch your condition.  Will get help right away if you are not doing well or get worse. Document Released: 08/23/2005 Document Revised: 10/30/2012 Document Reviewed: 10/17/2011 [facility] Patient Information 2014 [place], [place].  Head Injury, Adult You have received a head injury. It does not appear serious at this time. Headaches and vomiting are common following head injury. It should be easy to awaken from sleeping. Sometimes it is necessary for you to stay in the emergency department for a while for observation. Sometimes admission to the hospital may be needed. After injuries such as yours, most problems occur within the first 24 hours, but  side effects may occur up to 7 10 days after the injury. It is important for you to carefully monitor your condition and contact your health care provider or seek immediate medical care if there is a change in your condition. WHAT ARE THE TYPES OF HEAD INJURIES? Head injuries can be as minor as a bump. Some head injuries can be more severe. More severe head injuries include:  A jarring injury to the brain (concussion).  A bruise of the brain (contusion). This mean there is bleeding in the brain that can cause swelling.  A cracked skull (skull fracture).  Bleeding in the brain that  collects, clots, and forms a bump (hematoma). WHAT CAUSES A HEAD INJURY? A serious head injury is most likely to happen to someone who is in a car wreck and is not wearing a seat belt. Other causes of major head injuries include bicycle or motorcycle accidents, sports injuries, and falls. HOW ARE HEAD INJURIES DIAGNOSED? A complete history of the event leading to the injury and your current symptoms will be helpful in diagnosing head injuries. Many times, pictures of the brain, such as CT or MRI are needed to see the extent of the injury. Often, an overnight hospital stay is necessary for observation.  WHEN SHOULD I SEEK IMMEDIATE MEDICAL CARE?  You should get help right away if:  You have confusion or drowsiness.  You feel sick to your stomach (nauseous) or have continued, forceful vomiting.  You have dizziness or unsteadiness that is getting worse.  You have severe, continued headaches not relieved by medicine. Only take over-the-counter or prescription medicines for pain, fever, or discomfort as directed by your health care provider.  You do not have normal function of the arms or legs or are unable to walk.  You notice changes in the black spots in the center of the colored part of your eye (pupil).  You have a clear or bloody fluid coming from your nose or ears.  You have a loss of vision. During the next 24 hours after the injury, you must stay with someone who can watch you for the warning signs. This person should contact local emergency services (911 in the [place] if you have seizures, you become unconscious, or you are unable to wake up. HOW CAN I PREVENT A HEAD INJURY IN THE FUTURE? The most important factor for preventing major head injuries is avoiding motor vehicle accidents. To minimize the potential for damage to your head, it is crucial to wear seat belts while riding in motor vehicles. Wearing helmets while bike riding and playing collision sports (like football) is also  helpful. Also, avoiding dangerous activities around the house will further help reduce your risk of head injury.  WHEN CAN I RETURN TO NORMAL ACTIVITIES AND ATHLETICS? You should be reevaluated by your health care provider before returning to these activities. If you have any of the following symptoms, you should not return to activities or contact sports until 1 week after the symptoms have stopped:  Persistent headache.  Dizziness or vertigo.  Poor attention and concentration.  Confusion.  Memory problems.  Nausea or vomiting.  Fatigue or tire easily.  Irritability.  Intolerant of bright lights or loud noises.  Anxiety or depression.  Disturbed sleep. MAKE SURE YOU:   Understand these instructions.  Will watch your condition.  Will get help right away if you are not doing well or get worse. Document Released: 11/13/2005 Document Revised: 09/03/2013 Document Reviewed: 07/21/2013 [employer] Patient Information 2014  [employer] Contusion A contusion is a deep bruise. Contusions are the result of an injury that caused bleeding under the skin. The contusion may turn blue, purple, or yellow. Minor injuries will give you a painless contusion, but more severe contusions may stay painful and swollen for a few weeks.  CAUSES  A contusion is usually caused by a blow, trauma, or direct force to an area of the body. SYMPTOMS   Swelling and redness of the injured area.  Bruising of the injured area.  Tenderness and soreness of the injured area.  Pain. DIAGNOSIS  The diagnosis can be made by taking a history and physical exam. An X-ray, CT scan, or MRI may be needed to determine if there were any associated injuries, such as fractures. TREATMENT  Specific treatment will depend on what area of the body was injured. In general, the best treatment for a contusion is resting, icing, elevating, and applying cold compresses to the injured area. Over-the-counter medicines may also be  recommended for pain control. Ask your caregiver what the best treatment is for your contusion. HOME CARE INSTRUCTIONS   Put ice on the injured area.  Put ice in a plastic bag.  Place a towel between your skin and the bag.  Leave the ice on for 15-20 minutes, 03-04 times a day.  Only take over-the-counter or prescription medicines for pain, discomfort, or fever as directed by your caregiver. Your caregiver may recommend avoiding anti-inflammatory medicines (aspirin, ibuprofen, and naproxen) for 48 hours because these medicines may increase bruising.  Rest the injured area.  If possible, elevate the injured area to reduce swelling. SEEK IMMEDIATE MEDICAL CARE IF:   You have increased bruising or swelling.  You have pain that is getting worse.  Your swelling or pain is not relieved with medicines. MAKE SURE YOU:   Understand these instructions.  Will watch your condition.  Will get help right away if you are not doing well or get worse. Document Released: 08/23/2005 Document Revised: 02/05/2012 Document Reviewed: 09/18/2011 [facility] Patient Information 2014 [place], [place].

## 2014-08-26 ENCOUNTER — Other Ambulatory Visit: Payer: Self-pay | Admitting: Internal Medicine

## 2014-08-26 ENCOUNTER — Ambulatory Visit
Admission: RE | Admit: 2014-08-26 | Discharge: 2014-08-26 | Disposition: A | Discharge status: Home / Self Care | Payer: Medicare Other | Source: Ambulatory Visit | Attending: Internal Medicine | Admitting: Internal Medicine

## 2014-08-26 DIAGNOSIS — R42 Dizziness and giddiness: Secondary | ICD-10-CM

## 2015-12-30 ENCOUNTER — Other Ambulatory Visit: Payer: Self-pay | Admitting: Orthopedic Surgery

## 2016-01-18 NOTE — Pre-Procedure Instructions (Signed)
Christina Green  01/18/2016      [id number] [address] [address] Phone: [phone] Fax: [phone]  [facility] DELIVERY - [place], [place] [address] [place] [address] Phone: [phone] Fax: [phone]    Your procedure is scheduled on Mon, Mar 6 @ 9:00 AM  Report to [facility] Admitting at 7:00 AM  Call this number if you have problems the morning of surgery:  [phone]   Remember:  Do not eat food or drink liquids after midnight.  Take these medicines the morning of surgery with A SIP OF WATER Duloxetine(Cymbalta) and Labetalol(Normodyne)              Stop taking your Ibuprofen and Fish Oil. No Goody's,BC's,Aleve,Aspirin,Advil,Motrin,or any Herbal Medications.    Do not wear jewelry, make-up or nail polish.  Do not wear lotions, powders, or perfumes.  You may wear deodorant.  Do not shave 48 hours prior to surgery.   Do not bring valuables to the hospital.  [facility] is not responsible for any belongings or valuables.  Contacts, dentures or bridgework may not be worn into surgery.  Leave your suitcase in the car.  After surgery it may be brought to your room.  For patients admitted to the hospital, discharge time will be determined by your treatment team.  Patients discharged the day of surgery will not be allowed to drive home.    Special instructions:  [facility] - Preparing for Surgery  Before surgery, you can play an important role.  Because skin is not sterile, your skin needs to be as free of germs as possible.  You can reduce the number of germs on you skin by washing with CHG (chlorahexidine gluconate) soap before surgery.  CHG is an antiseptic cleaner which kills germs and bonds with the skin to continue killing germs even after washing.  Please DO NOT use if you have an allergy to CHG or antibacterial soaps.  If your  skin becomes reddened/irritated stop using the CHG and inform your nurse when you arrive at Short Stay.  Do not shave (including legs and underarms) for at least 48 hours prior to the first CHG shower.  You may shave your face.  Please follow these instructions carefully:   1.  Shower with CHG Soap the night before surgery and the                                morning of Surgery.  2.  If you choose to wash your hair, wash your hair first as usual with your       normal shampoo.  3.  After you shampoo, rinse your hair and body thoroughly to remove the                      Shampoo.  4.  Use CHG as you would any other liquid soap.  You can apply chg directly       to the skin and wash gently with scrungie or a clean washcloth.  5.  Apply the CHG Soap to your body ONLY FROM THE NECK DOWN.        Do not use on open wounds or open sores.  Avoid contact with your eyes,       ears, mouth and  genitals (private parts).  Wash genitals (private parts)       with your normal soap.  6.  Wash thoroughly, paying special attention to the area where your surgery        will be performed.  7.  Thoroughly rinse your body with warm water from the neck down.  8.  DO NOT shower/wash with your normal soap after using and rinsing off       the CHG Soap.  9.  Pat yourself dry with a clean towel.            10.  Wear clean pajamas.            11.  Place clean sheets on your bed the night of your first shower and do not        sleep with pets.  Day of Surgery  Do not apply any lotions/deoderants the morning of surgery.  Please wear clean clothes to the hospital/surgery center.    Please read over the following fact sheets that you were given. Pain Booklet, Coughing and Deep Breathing, Blood Transfusion Information, MRSA Information and Surgical Site Infection Prevention

## 2016-01-19 ENCOUNTER — Encounter (HOSPITAL_COMMUNITY)
Admission: RE | Admit: 2016-01-19 | Discharge: 2016-01-19 | Disposition: A | Discharge status: Home / Self Care | Payer: Medicare Other | Source: Ambulatory Visit | Attending: Orthopedic Surgery | Admitting: Orthopedic Surgery

## 2016-01-19 ENCOUNTER — Encounter (HOSPITAL_COMMUNITY): Payer: Self-pay

## 2016-01-19 DIAGNOSIS — R918 Other nonspecific abnormal finding of lung field: Secondary | ICD-10-CM | POA: Diagnosis not present

## 2016-01-19 DIAGNOSIS — M1711 Unilateral primary osteoarthritis, right knee: Secondary | ICD-10-CM | POA: Insufficient documentation

## 2016-01-19 DIAGNOSIS — Z79899 Other long term (current) drug therapy: Secondary | ICD-10-CM | POA: Diagnosis not present

## 2016-01-19 DIAGNOSIS — Z0183 Encounter for blood typing: Secondary | ICD-10-CM | POA: Insufficient documentation

## 2016-01-19 DIAGNOSIS — Z01818 Encounter for other preprocedural examination: Secondary | ICD-10-CM | POA: Diagnosis present

## 2016-01-19 DIAGNOSIS — H9190 Unspecified hearing loss, unspecified ear: Secondary | ICD-10-CM | POA: Insufficient documentation

## 2016-01-19 DIAGNOSIS — I517 Cardiomegaly: Secondary | ICD-10-CM | POA: Insufficient documentation

## 2016-01-19 DIAGNOSIS — I10 Essential (primary) hypertension: Secondary | ICD-10-CM | POA: Diagnosis not present

## 2016-01-19 DIAGNOSIS — E785 Hyperlipidemia, unspecified: Secondary | ICD-10-CM | POA: Diagnosis not present

## 2016-01-19 DIAGNOSIS — Z01812 Encounter for preprocedural laboratory examination: Secondary | ICD-10-CM | POA: Diagnosis not present

## 2016-01-19 DIAGNOSIS — I447 Left bundle-branch block, unspecified: Secondary | ICD-10-CM | POA: Insufficient documentation

## 2016-01-19 HISTORY — DX: Psoriasis, unspecified: L40.9

## 2016-01-19 HISTORY — DX: Left bundle-branch block, unspecified: I44.7

## 2016-01-19 HISTORY — DX: Personal history of urinary calculi: Z87.442

## 2016-01-19 HISTORY — DX: Pain in unspecified joint: M25.50

## 2016-01-19 HISTORY — DX: Diarrhea, unspecified: R19.7

## 2016-01-19 HISTORY — DX: Reserved for inherently not codable concepts without codable children: IMO0001

## 2016-01-19 HISTORY — DX: Effusion, unspecified joint: M25.40

## 2016-01-19 HISTORY — DX: Personal history of other infectious and parasitic diseases: Z86.19

## 2016-01-19 HISTORY — DX: Unspecified hearing loss, unspecified ear: H91.90

## 2016-01-19 LAB — URINALYSIS, ROUTINE W REFLEX MICROSCOPIC
Bilirubin Urine: NEGATIVE
Glucose, UA: NEGATIVE mg/dL
HGB URINE DIPSTICK: NEGATIVE
Ketones, ur: NEGATIVE mg/dL
Leukocytes, UA: NEGATIVE
NITRITE: NEGATIVE
PH: 7 (ref 5.0–8.0)
Protein, ur: NEGATIVE mg/dL
SPECIFIC GRAVITY, URINE: 1.013 (ref 1.005–1.030)

## 2016-01-19 LAB — CBC WITH DIFFERENTIAL/PLATELET
Basophils Absolute: 0 10*3/uL (ref 0.0–0.1)
Basophils Relative: 0 %
Eosinophils Absolute: 0.1 10*3/uL (ref 0.0–0.7)
Eosinophils Relative: 1 %
HEMATOCRIT: 37.9 % (ref 36.0–46.0)
HEMOGLOBIN: 13.1 g/dL (ref 12.0–15.0)
LYMPHS ABS: 1.4 10*3/uL (ref 0.7–4.0)
LYMPHS PCT: 20 %
MCH: 32.1 pg (ref 26.0–34.0)
MCHC: 34.6 g/dL (ref 30.0–36.0)
MCV: 92.9 fL (ref 78.0–100.0)
MONO ABS: 0.6 10*3/uL (ref 0.1–1.0)
MONOS PCT: 9 %
NEUTROS ABS: 4.7 10*3/uL (ref 1.7–7.7)
NEUTROS PCT: 70 %
Platelets: 281 10*3/uL (ref 150–400)
RBC: 4.08 MIL/uL (ref 3.87–5.11)
RDW: 12.7 % (ref 11.5–15.5)
WBC: 6.7 10*3/uL (ref 4.0–10.5)

## 2016-01-19 LAB — PROTIME-INR
INR: 1.02 (ref 0.00–1.49)
PROTHROMBIN TIME: 13.6 s (ref 11.6–15.2)

## 2016-01-19 LAB — APTT: aPTT: 28 seconds (ref 24–37)

## 2016-01-19 LAB — COMPREHENSIVE METABOLIC PANEL
ALT: 13 U/L — ABNORMAL LOW (ref 14–54)
AST: 19 U/L (ref 15–41)
Albumin: 3.9 g/dL (ref 3.5–5.0)
Alkaline Phosphatase: 77 U/L (ref 38–126)
Anion gap: 11 (ref 5–15)
BUN: 11 mg/dL (ref 6–20)
CO2: 25 mmol/L (ref 22–32)
Calcium: 9.3 mg/dL (ref 8.9–10.3)
Chloride: 101 mmol/L (ref 101–111)
Creatinine, Ser: 0.69 mg/dL (ref 0.44–1.00)
GFR calc Af Amer: >60 mL/min (ref >60)
GFR calc non Af Amer: >60 mL/min (ref >60)
Glucose, Bld: 140 mg/dL — ABNORMAL HIGH (ref 65–99)
Potassium: 3.8 mmol/L (ref 3.5–5.1)
Sodium: 137 mmol/L (ref 135–145)
Total Bilirubin: 0.6 mg/dL (ref 0.3–1.2)
Total Protein: 6.6 g/dL (ref 6.5–8.1)

## 2016-01-19 LAB — TYPE AND SCREEN
ABO/RH(D): A NEG
Antibody Screen: NEGATIVE

## 2016-01-19 LAB — SURGICAL PCR SCREEN
MRSA, PCR: NEGATIVE
Staphylococcus aureus: NEGATIVE

## 2016-01-19 MED ORDER — CHLORHEXIDINE GLUCONATE 4 % EX LIQD: 60.0000 mL | Freq: Once | CUTANEOUS | Status: DC

## 2016-01-19 NOTE — Progress Notes (Addendum)
Cardiologist denies  Medical MD is Dr.John Laurann [place]  Echo denies  Stress test done 21 yrs ago  Heart cath denies  EKG denies in past yr  CXR denies in past yr

## 2016-01-20 ENCOUNTER — Encounter (HOSPITAL_COMMUNITY): Payer: Self-pay

## 2016-01-20 NOTE — Progress Notes (Signed)
Anesthesia Chart Review:  Pt is an 82 year old female scheduled for R total knee arthroplasty on 01/31/2016 with Dr. Berenice Primas.   PMH includes:  HTN, LBBB, hyperlipidemia, Graves' disease. Impaired hearing. Never smoker. BMI 28. S/p L TKA 09/30/10.  Medications include: lipitor, labetalol, losartan-hctz.   Preoperative labs reviewed.    Chest x-ray 01/19/16 reviewed.  1. Cardiomegaly. 2. Mild bilateral pulmonary interstitial prominence. Very mild interstitial edema or interstitial pneumonitis cannot be completely excluded.  EKG 01/19/16: Sinus bradycardia (58 bpm). Left axis deviation. Left bundle branch block. Appears similar to EKGs dating back to 09/23/10.   Nuclear stress test 09/29/10 (performed for eval of new LBBB) (found in correspondence dated 10/05/10 in media tab):  - Low risk, no ischemia, normal EF.   If no changes, I anticipate pt can proceed with surgery as scheduled.   Willeen Cass, FNP-BC [facility] Stay Surgical [facility]/Anesthesiology Phone: [phone] 01/20/2016 3:13 PM

## 2016-01-24 ENCOUNTER — Inpatient Hospital Stay: Admit: 2016-01-24 | Payer: Self-pay | Admitting: Orthopedic Surgery

## 2016-01-24 SURGERY — ARTHROPLASTY, KNEE, TOTAL
Anesthesia: Choice | Site: Knee | Laterality: Right

## 2016-01-28 MED ORDER — CLINDAMYCIN PHOSPHATE 900 MG/50ML IV SOLN
900.0000 mg | INTRAVENOUS | Status: AC
  Administered 2016-01-31: 900 mg via INTRAVENOUS
  Filled 2016-01-28: qty 50

## 2016-01-30 ENCOUNTER — Encounter (HOSPITAL_COMMUNITY): Payer: Self-pay | Admitting: Anesthesiology

## 2016-01-30 NOTE — Anesthesia Preprocedure Evaluation (Addendum)
Anesthesia Evaluation  Patient identified by MRN, date of birth, ID band Patient awake    Reviewed: Allergy & Precautions, NPO status , Patient's Chart, lab work & pertinent test results, reviewed documented beta blocker date and time   Airway Mallampati: III  TM Distance: >3 FB Neck ROM: Full    Dental no notable dental hx. (+) Teeth Intact   Pulmonary neg pulmonary ROS,    Pulmonary exam normal breath sounds clear to auscultation       Cardiovascular hypertension, Pt. on medications and Pt. on home beta blockers + dysrhythmias  Rhythm:Regular Rate:Normal  LBBB   Neuro/Psych  Headaches, PSYCHIATRIC DISORDERS Depression Impaired hearing bilaterally    GI/Hepatic negative GI ROS, Neg liver ROS,   Endo/Other  Hyperthyroidism Hx/o Graves disease Hyperlipidemia Adrenal adenoma  Renal/GU Hx/o renal calculi   negative genitourinary   Musculoskeletal  (+) Arthritis , Osteoarthritis,  DJD Right knee   Abdominal   Peds  Hematology negative hematology ROS (+)   Anesthesia Other Findings   Reproductive/Obstetrics                           Anesthesia Physical Anesthesia Plan  ASA: III  Anesthesia Plan: Spinal   Post-op Pain Management:    Induction:   Airway Management Planned: Natural Airway  Additional Equipment:   Intra-op Plan:   Post-operative Plan:   Informed Consent: I have reviewed the patients History and Physical, chart, labs and discussed the procedure including the risks, benefits and alternatives for the proposed anesthesia with the patient or authorized representative who has indicated his/her understanding and acceptance.   Dental advisory given  Plan Discussed with:   Anesthesia Plan Comments:         Anesthesia Quick Evaluation

## 2016-01-30 NOTE — H&P (Signed)
TOTAL KNEE ADMISSION H&P  Patient is being admitted for right total knee arthroplasty.  Subjective:  Chief Complaint:right knee pain.  HPI: Christina Green, 82 y.o. female, has a history of pain and functional disability in the right knee due to arthritis and has failed non-surgical conservative treatments for greater than 12 weeks to includeNSAID's and/or analgesics, corticosteriod injections, viscosupplementation injections and activity modification.  Onset of symptoms was gradual, starting 5 years ago with gradually worsening course since that time. The patient noted no past surgery on the right knee(s).  Patient currently rates pain in the right knee(s) at 8 out of 10 with activity. Patient has night pain, worsening of pain with activity and weight bearing and joint swelling.  Patient has evidence of subchondral cysts, subchondral sclerosis, periarticular osteophytes, joint subluxation and joint space narrowing by imaging studies. This patient has had failure of all reasonable conservative care. There is no active infection.  There are no active problems to display for this patient.  Past Medical History  Diagnosis Date  . Hyperlipemia     takes Atorvastatin daily  . DJD (degenerative joint disease) of knee     and hands  . Ryka Beighley disease   . Hypertension     takes Hyzaar and Labetalol daily  . Depression     takes Cymbalta daily  . Adrenal adenoma     benign  . Impaired hearing     wears hearing aides  . Headache     nightly  . Joint pain   . Psoriasis   . Joint swelling   . Diarrhea     "often" takes Immodium as needed  . History of kidney stones   . History of shingles   . Left bundle branch block (LBBB)     identified in 2011    Past Surgical History  Procedure Laterality Date  . Cholecystectomy    . Cataract extraction    . Replacement total knee Left   . Cardiac catheterization    . Knee arthroscopy w/ meniscal repair Right   . Colonscopy      No  prescriptions prior to admission   Allergies  Allergen Reactions  . Penicillins Other (See Comments)    unknown    Social History  Substance Use Topics  . Smoking status: Never Smoker   . Smokeless tobacco: Not on file  . Alcohol Use: Yes     Comment: wine occasionally    Family History  Problem Relation Age of Onset  . Hypertension Mother   . Heart disease Mother   . Heart disease Father   . Hypertension Father   . Hypertension Sister   . CVA Sister      ROS ROS: I have reviewed the patient's review of systems thoroughly and there are no positive responses as relates to the HPI. Objective:  Physical Exam  Vital signs in last 24 hours:   Well-developed well-nourished patient in no acute distress. Alert and oriented x3 HEENT:within normal limits Cardiac: Regular rate and rhythm Pulmonary: Lungs clear to auscultation Abdomen: Soft and nontender.  Normal active bowel sounds  Musculoskeletal: (*right knee: Painful range of motion.  Limited range of motion 0-90.  No instability.  Trace effusion.  Neurovascularly intact distally. Labs:  Recent Results (from the past 2160 hour(s))  Surgical pcr screen     Status: None   Collection Time: 01/19/16 10:26 AM  Result Value Ref Range   MRSA, PCR NEGATIVE NEGATIVE   Staphylococcus aureus NEGATIVE NEGATIVE  Comment:        The Xpert SA Assay (FDA approved for NASAL specimens in patients over 65 years of age), is one component of a comprehensive surveillance program.  Test performance has been validated by [facility] for patients greater than or equal to 28 year old. It is not intended to diagnose infection nor to guide or monitor treatment.   APTT     Status: None   Collection Time: 01/19/16 10:28 AM  Result Value Ref Range   aPTT 28 24 - 37 seconds  CBC WITH DIFFERENTIAL     Status: None   Collection Time: 01/19/16 10:28 AM  Result Value Ref Range   WBC 6.7 4.0 - 10.5 K/uL   RBC 4.08 3.87 - 5.11 MIL/uL    Hemoglobin 13.1 12.0 - 15.0 g/dL   HCT 37.9 36.0 - 46.0 %   MCV 92.9 78.0 - 100.0 fL   MCH 32.1 26.0 - 34.0 pg   MCHC 34.6 30.0 - 36.0 g/dL   RDW 12.7 11.5 - 15.5 %   Platelets 281 150 - 400 K/uL   Neutrophils Relative % 70 %   Neutro Abs 4.7 1.7 - 7.7 K/uL   Lymphocytes Relative 20 %   Lymphs Abs 1.4 0.7 - 4.0 K/uL   Monocytes Relative 9 %   Monocytes Absolute 0.6 0.1 - 1.0 K/uL   Eosinophils Relative 1 %   Eosinophils Absolute 0.1 0.0 - 0.7 K/uL   Basophils Relative 0 %   Basophils Absolute 0.0 0.0 - 0.1 K/uL  Comprehensive metabolic panel     Status: Abnormal   Collection Time: 01/19/16 10:28 AM  Result Value Ref Range   Sodium 137 135 - 145 mmol/L   Potassium 3.8 3.5 - 5.1 mmol/L   Chloride 101 101 - 111 mmol/L   CO2 25 22 - 32 mmol/L   Glucose, Bld 140 (H) 65 - 99 mg/dL   BUN 11 6 - 20 mg/dL   Creatinine, Ser 0.69 0.44 - 1.00 mg/dL   Calcium 9.3 8.9 - 10.3 mg/dL   Total Protein 6.6 6.5 - 8.1 g/dL   Albumin 3.9 3.5 - 5.0 g/dL   AST 19 15 - 41 U/L   ALT 13 (L) 14 - 54 U/L   Alkaline Phosphatase 77 38 - 126 U/L   Total Bilirubin 0.6 0.3 - 1.2 mg/dL   GFR calc non Af Amer >60 >60 mL/min   GFR calc Af Amer >60 >60 mL/min    Comment: (NOTE) The eGFR has been calculated using the CKD EPI equation. This calculation has not been validated in all clinical situations. eGFR's persistently <60 mL/min signify possible Chronic Kidney Disease.    Anion gap 11 5 - 15  Protime-INR     Status: None   Collection Time: 01/19/16 10:28 AM  Result Value Ref Range   Prothrombin Time 13.6 11.6 - 15.2 seconds   INR 1.02 0.00 - 1.49  Type and screen Order type and screen if day of surgery is less than 15 days from draw of preadmission visit or order morning of surgery if day of surgery is greater than 6 days from preadmission visit.     Status: None   Collection Time: 01/19/16 10:28 AM  Result Value Ref Range   ABO/RH(D) A NEG    Antibody Screen NEG    Sample Expiration 02/02/2016     Extend sample reason NO TRANSFUSIONS OR PREGNANCY IN THE PAST 3 MONTHS   Urinalysis, Routine w reflex microscopic (  not at [facility])     Status: None   Collection Time: 01/19/16 10:40 AM  Result Value Ref Range   Color, Urine YELLOW YELLOW   APPearance CLEAR CLEAR   Specific Gravity, Urine 1.013 1.005 - 1.030   pH 7.0 5.0 - 8.0   Glucose, UA NEGATIVE NEGATIVE mg/dL   Hgb urine dipstick NEGATIVE NEGATIVE   Bilirubin Urine NEGATIVE NEGATIVE   Ketones, ur NEGATIVE NEGATIVE mg/dL   Protein, ur NEGATIVE NEGATIVE mg/dL   Nitrite NEGATIVE NEGATIVE   Leukocytes, UA NEGATIVE NEGATIVE    Comment: MICROSCOPIC NOT DONE ON URINES WITH NEGATIVE PROTEIN, BLOOD, LEUKOCYTES, NITRITE, OR GLUCOSE <1000 mg/dL.   Estimated body mass index is 26.24 kg/(m^2) as calculated from the following:   Height as of 06/25/13: 4' 11"  (1.499 m).   Weight as of 06/25/13: 58.968 kg (130 lb).   Imaging Review Plain radiographs demonstrate severe degenerative joint disease of the right knee(s). The overall alignment ismild varus. The bone quality appears to be fair for age and reported activity level.  Assessment/Plan:  End stage arthritis, right knee   The patient history, physical examination, clinical judgment of the provider and imaging studies are consistent with end stage degenerative joint disease of the right knee(s) and total knee arthroplasty is deemed medically necessary. The treatment options including medical management, injection therapy arthroscopy and arthroplasty were discussed at length. The risks and benefits of total knee arthroplasty were presented and reviewed. The risks due to aseptic loosening, infection, stiffness, patella tracking problems, thromboembolic complications and other imponderables were discussed. The patient acknowledged the explanation, agreed to proceed with the plan and consent was signed. Patient is being admitted for inpatient treatment for surgery, pain control, PT, OT, prophylactic  antibiotics, VTE prophylaxis, progressive ambulation and ADL's and discharge planning. The patient is planning to be discharged to skilled nursing facility

## 2016-01-31 ENCOUNTER — Inpatient Hospital Stay (HOSPITAL_COMMUNITY): Payer: Medicare Other | Admitting: Emergency Medicine

## 2016-01-31 ENCOUNTER — Inpatient Hospital Stay (HOSPITAL_COMMUNITY): Payer: Medicare Other | Admitting: Anesthesiology

## 2016-01-31 ENCOUNTER — Encounter (HOSPITAL_COMMUNITY): Payer: Self-pay | Admitting: Certified Registered Nurse Anesthetist

## 2016-01-31 ENCOUNTER — Inpatient Hospital Stay (HOSPITAL_COMMUNITY)
Admission: RE | Admit: 2016-01-31 | Discharge: 2016-02-03 | DRG: 470 | Disposition: A | Discharge status: Skilled Nursing Facility | Payer: Medicare Other | Source: Ambulatory Visit | Attending: Orthopedic Surgery | Admitting: Orthopedic Surgery

## 2016-01-31 ENCOUNTER — Encounter (HOSPITAL_COMMUNITY)
Admission: RE | Disposition: A | Discharge status: Skilled Nursing Facility | Payer: Self-pay | Source: Ambulatory Visit | Attending: Orthopedic Surgery

## 2016-01-31 DIAGNOSIS — Z96652 Presence of left artificial knee joint: Secondary | ICD-10-CM | POA: Diagnosis present

## 2016-01-31 DIAGNOSIS — F329 Major depressive disorder, single episode, unspecified: Secondary | ICD-10-CM | POA: Diagnosis present

## 2016-01-31 DIAGNOSIS — H9190 Unspecified hearing loss, unspecified ear: Secondary | ICD-10-CM | POA: Diagnosis present

## 2016-01-31 DIAGNOSIS — M1711 Unilateral primary osteoarthritis, right knee: Secondary | ICD-10-CM | POA: Diagnosis present

## 2016-01-31 DIAGNOSIS — E785 Hyperlipidemia, unspecified: Secondary | ICD-10-CM | POA: Diagnosis present

## 2016-01-31 DIAGNOSIS — I10 Essential (primary) hypertension: Secondary | ICD-10-CM | POA: Diagnosis present

## 2016-01-31 DIAGNOSIS — D62 Acute posthemorrhagic anemia: Secondary | ICD-10-CM

## 2016-01-31 DIAGNOSIS — M25561 Pain in right knee: Secondary | ICD-10-CM | POA: Diagnosis present

## 2016-01-31 HISTORY — PX: TOTAL KNEE ARTHROPLASTY: SHX125

## 2016-01-31 SURGERY — ARTHROPLASTY, KNEE, TOTAL
Anesthesia: Spinal | Site: Knee | Laterality: Right

## 2016-01-31 MED ORDER — DIPHENHYDRAMINE HCL 12.5 MG/5ML PO ELIX
12.5000 mg | ORAL_SOLUTION | ORAL | Status: DC | PRN
Start: 2016-01-31 — End: 2016-02-03

## 2016-01-31 MED ORDER — LACTATED RINGERS IV SOLN
INTRAVENOUS | Status: DC
  Administered 2016-01-31 (×2): via INTRAVENOUS

## 2016-01-31 MED ORDER — SUCCINYLCHOLINE CHLORIDE 20 MG/ML IJ SOLN
INTRAMUSCULAR | Status: AC
Start: 2016-01-31 — End: 2016-01-31
  Filled 2016-01-31: qty 1

## 2016-01-31 MED ORDER — ROCURONIUM BROMIDE 50 MG/5ML IV SOLN
INTRAVENOUS | Status: AC
  Filled 2016-01-31: qty 1

## 2016-01-31 MED ORDER — 0.9 % SODIUM CHLORIDE (POUR BTL) OPTIME
TOPICAL | Status: DC | PRN
  Administered 2016-01-31: 1000 mL

## 2016-01-31 MED ORDER — LOSARTAN POTASSIUM-HCTZ 100-12.5 MG PO TABS: 1.0000 | ORAL_TABLET | Freq: Every day | ORAL | Status: DC

## 2016-01-31 MED ORDER — POLYETHYLENE GLYCOL 3350 17 G PO PACK
17.0000 g | PACK | Freq: Every day | ORAL | Status: DC | PRN
Start: 2016-01-31 — End: 2016-02-03

## 2016-01-31 MED ORDER — LOSARTAN POTASSIUM 50 MG PO TABS
100.0000 mg | ORAL_TABLET | Freq: Every day | ORAL | Status: DC
  Administered 2016-01-31 – 2016-02-03 (×4): 100 mg via ORAL
  Filled 2016-01-31 (×4): qty 2

## 2016-01-31 MED ORDER — FENTANYL CITRATE (PF) 100 MCG/2ML IJ SOLN
INTRAMUSCULAR | Status: AC
  Filled 2016-01-31: qty 2

## 2016-01-31 MED ORDER — ONDANSETRON HCL 4 MG PO TABS: 4.0000 mg | ORAL_TABLET | Freq: Four times a day (QID) | ORAL | Status: DC | PRN

## 2016-01-31 MED ORDER — HYDROCODONE-ACETAMINOPHEN 5-325 MG PO TABS
ORAL_TABLET | ORAL | Status: AC
  Administered 2016-01-31: 2
  Filled 2016-01-31: qty 2

## 2016-01-31 MED ORDER — ZOLPIDEM TARTRATE 5 MG PO TABS: 5.0000 mg | ORAL_TABLET | Freq: Every evening | ORAL | Status: DC | PRN

## 2016-01-31 MED ORDER — PHENYLEPHRINE HCL 10 MG/ML IJ SOLN
10.0000 mg | INTRAVENOUS | Status: DC | PRN
  Administered 2016-01-31: 20 ug/min via INTRAVENOUS

## 2016-01-31 MED ORDER — ACETAMINOPHEN 650 MG RE SUPP: 650.0000 mg | Freq: Four times a day (QID) | RECTAL | Status: DC | PRN

## 2016-01-31 MED ORDER — TRANEXAMIC ACID 1000 MG/10ML IV SOLN
1000.0000 mg | INTRAVENOUS | Status: AC
  Administered 2016-01-31: 1000 mg via INTRAVENOUS
  Filled 2016-01-31: qty 10

## 2016-01-31 MED ORDER — HYDROCODONE-ACETAMINOPHEN 10-325 MG PO TABS
1.0000 | ORAL_TABLET | ORAL | Status: DC | PRN
  Administered 2016-01-31 – 2016-02-01 (×2): 2 via ORAL
  Administered 2016-02-01: 1 via ORAL
  Administered 2016-02-02 – 2016-02-03 (×3): 2 via ORAL
  Filled 2016-01-31 (×3): qty 2
  Filled 2016-01-31: qty 1
  Filled 2016-01-31: qty 2
  Filled 2016-01-31: qty 1
  Filled 2016-01-31: qty 2

## 2016-01-31 MED ORDER — BUPIVACAINE LIPOSOME 1.3 % IJ SUSP
20.0000 mL | INTRAMUSCULAR | Status: DC
  Filled 2016-01-31: qty 20

## 2016-01-31 MED ORDER — PROPOFOL 10 MG/ML IV BOLUS
INTRAVENOUS | Status: DC | PRN
  Administered 2016-01-31 (×2): 15 mg via INTRAVENOUS

## 2016-01-31 MED ORDER — DOCUSATE SODIUM 100 MG PO CAPS
100.0000 mg | ORAL_CAPSULE | Freq: Two times a day (BID) | ORAL | Status: DC
  Administered 2016-01-31 – 2016-02-03 (×6): 100 mg via ORAL
  Filled 2016-01-31 (×6): qty 1

## 2016-01-31 MED ORDER — SODIUM CHLORIDE 0.9 % IV SOLN
1000.0000 mg | Freq: Once | INTRAVENOUS | Status: DC
  Filled 2016-01-31: qty 10

## 2016-01-31 MED ORDER — ASPIRIN EC 325 MG PO TBEC
325.0000 mg | DELAYED_RELEASE_TABLET | Freq: Two times a day (BID) | ORAL | Status: DC
  Administered 2016-01-31 – 2016-02-03 (×6): 325 mg via ORAL
  Filled 2016-01-31 (×6): qty 1

## 2016-01-31 MED ORDER — BUPIVACAINE IN DEXTROSE 0.75-8.25 % IT SOLN
INTRATHECAL | Status: DC | PRN
  Administered 2016-01-31: 1.4 mL via INTRATHECAL

## 2016-01-31 MED ORDER — DEXAMETHASONE SODIUM PHOSPHATE 10 MG/ML IJ SOLN
10.0000 mg | Freq: Two times a day (BID) | INTRAMUSCULAR | Status: AC
  Administered 2016-01-31 (×2): 10 mg via INTRAVENOUS
  Filled 2016-01-31 (×2): qty 1

## 2016-01-31 MED ORDER — HYDROCHLOROTHIAZIDE 12.5 MG PO CAPS
12.5000 mg | ORAL_CAPSULE | Freq: Every day | ORAL | Status: DC
  Administered 2016-01-31 – 2016-02-03 (×4): 12.5 mg via ORAL
  Filled 2016-01-31 (×4): qty 1

## 2016-01-31 MED ORDER — SODIUM CHLORIDE 0.9 % IV SOLN
INTRAVENOUS | Status: DC
  Administered 2016-01-31: via INTRAVENOUS

## 2016-01-31 MED ORDER — ONDANSETRON HCL 4 MG/2ML IJ SOLN: 4.0000 mg | Freq: Four times a day (QID) | INTRAMUSCULAR | Status: DC | PRN

## 2016-01-31 MED ORDER — BUPIVACAINE HCL (PF) 0.5 % IJ SOLN
INTRAMUSCULAR | Status: AC
Start: 2016-01-31 — End: 2016-01-31
  Filled 2016-01-31: qty 30

## 2016-01-31 MED ORDER — FENTANYL CITRATE (PF) 250 MCG/5ML IJ SOLN
INTRAMUSCULAR | Status: AC
  Filled 2016-01-31: qty 5

## 2016-01-31 MED ORDER — CLINDAMYCIN PHOSPHATE 600 MG/50ML IV SOLN
600.0000 mg | Freq: Four times a day (QID) | INTRAVENOUS | Status: AC
  Administered 2016-01-31 – 2016-02-01 (×2): 600 mg via INTRAVENOUS
  Filled 2016-01-31 (×3): qty 50

## 2016-01-31 MED ORDER — ATORVASTATIN CALCIUM 40 MG PO TABS
40.0000 mg | ORAL_TABLET | Freq: Every day | ORAL | Status: DC
  Administered 2016-01-31 – 2016-02-03 (×4): 40 mg via ORAL
  Filled 2016-01-31 (×4): qty 1

## 2016-01-31 MED ORDER — PROPOFOL 10 MG/ML IV BOLUS
INTRAVENOUS | Status: AC
  Filled 2016-01-31: qty 20

## 2016-01-31 MED ORDER — PROMETHAZINE HCL 25 MG/ML IJ SOLN: 6.2500 mg | Freq: Four times a day (QID) | INTRAMUSCULAR | Status: DC | PRN

## 2016-01-31 MED ORDER — BUPIVACAINE HCL (PF) 0.5 % IJ SOLN
INTRAMUSCULAR | Status: DC | PRN
  Administered 2016-01-31: 20 mL

## 2016-01-31 MED ORDER — BUPIVACAINE LIPOSOME 1.3 % IJ SUSP
INTRAMUSCULAR | Status: DC | PRN
  Administered 2016-01-31: 20 mL

## 2016-01-31 MED ORDER — DULOXETINE HCL 60 MG PO CPEP
60.0000 mg | ORAL_CAPSULE | Freq: Every day | ORAL | Status: DC
  Administered 2016-02-01 – 2016-02-03 (×3): 60 mg via ORAL
  Filled 2016-01-31 (×4): qty 1

## 2016-01-31 MED ORDER — ASPIRIN EC 325 MG PO TBEC: 325.0000 mg | DELAYED_RELEASE_TABLET | Freq: Two times a day (BID) | ORAL | Status: DC

## 2016-01-31 MED ORDER — FENTANYL CITRATE (PF) 100 MCG/2ML IJ SOLN
INTRAMUSCULAR | Status: DC
Start: 2016-01-31 — End: 2016-01-31
  Filled 2016-01-31: qty 2

## 2016-01-31 MED ORDER — METHOCARBAMOL 500 MG PO TABS
500.0000 mg | ORAL_TABLET | Freq: Four times a day (QID) | ORAL | Status: DC | PRN
  Administered 2016-02-02: 500 mg via ORAL
  Filled 2016-01-31: qty 1

## 2016-01-31 MED ORDER — MAGNESIUM CITRATE PO SOLN: 1.0000 | Freq: Once | ORAL | Status: DC | PRN

## 2016-01-31 MED ORDER — PROPOFOL 500 MG/50ML IV EMUL
INTRAVENOUS | Status: DC | PRN
  Administered 2016-01-31: 35 ug/kg/min via INTRAVENOUS

## 2016-01-31 MED ORDER — AMLODIPINE BESYLATE 5 MG PO TABS
5.0000 mg | ORAL_TABLET | Freq: Every day | ORAL | Status: DC
  Administered 2016-02-01 – 2016-02-03 (×3): 5 mg via ORAL
  Filled 2016-01-31 (×3): qty 1

## 2016-01-31 MED ORDER — HYDROMORPHONE HCL 1 MG/ML IJ SOLN
0.5000 mg | INTRAMUSCULAR | Status: DC | PRN
  Administered 2016-01-31: 0.5 mg via INTRAVENOUS
  Filled 2016-01-31: qty 1

## 2016-01-31 MED ORDER — FENTANYL CITRATE (PF) 100 MCG/2ML IJ SOLN
INTRAMUSCULAR | Status: AC
  Administered 2016-01-31: 25 ug via INTRAVENOUS
  Filled 2016-01-31: qty 2

## 2016-01-31 MED ORDER — ONDANSETRON HCL 4 MG/2ML IJ SOLN: 4.0000 mg | Freq: Once | INTRAMUSCULAR | Status: DC | PRN

## 2016-01-31 MED ORDER — METHOCARBAMOL 1000 MG/10ML IJ SOLN
500.0000 mg | Freq: Four times a day (QID) | INTRAVENOUS | Status: DC | PRN
  Filled 2016-01-31: qty 5

## 2016-01-31 MED ORDER — ACETAMINOPHEN 325 MG PO TABS: 650.0000 mg | ORAL_TABLET | Freq: Four times a day (QID) | ORAL | Status: DC | PRN

## 2016-01-31 MED ORDER — LIDOCAINE HCL (CARDIAC) 20 MG/ML IV SOLN
INTRAVENOUS | Status: AC
  Filled 2016-01-31: qty 5

## 2016-01-31 MED ORDER — ALUM & MAG HYDROXIDE-SIMETH 200-200-20 MG/5ML PO SUSP: 30.0000 mL | ORAL | Status: DC | PRN

## 2016-01-31 MED ORDER — HYDROCODONE-ACETAMINOPHEN 5-325 MG PO TABS: 1.0000 | ORAL_TABLET | Freq: Four times a day (QID) | ORAL | Status: DC | PRN

## 2016-01-31 MED ORDER — BISACODYL 5 MG PO TBEC: 5.0000 mg | DELAYED_RELEASE_TABLET | Freq: Every day | ORAL | Status: DC | PRN

## 2016-01-31 MED ORDER — FENTANYL CITRATE (PF) 100 MCG/2ML IJ SOLN
25.0000 ug | INTRAMUSCULAR | Status: DC | PRN
  Administered 2016-01-31 (×3): 25 ug via INTRAVENOUS
  Administered 2016-01-31: 50 ug via INTRAVENOUS

## 2016-01-31 MED ORDER — LABETALOL HCL 200 MG PO TABS
200.0000 mg | ORAL_TABLET | Freq: Two times a day (BID) | ORAL | Status: DC
  Administered 2016-01-31 – 2016-02-03 (×6): 200 mg via ORAL
  Filled 2016-01-31 (×8): qty 1

## 2016-01-31 MED ORDER — MIDAZOLAM HCL 2 MG/2ML IJ SOLN
INTRAMUSCULAR | Status: AC
  Filled 2016-01-31: qty 2

## 2016-01-31 SURGICAL SUPPLY — 62 items
BANDAGE ESMARK 6X9 LF (GAUZE/BANDAGES/DRESSINGS) ×1 IMPLANT
BENZOIN TINCTURE PRP APPL 2/3 (GAUZE/BANDAGES/DRESSINGS) IMPLANT
BLADE SAGITTAL 25.0X1.19X90 (BLADE) ×2 IMPLANT
BLADE SAGITTAL 25.0X1.19X90MM (BLADE) ×1
BLADE SAW SAG 90X13X1.27 (BLADE) ×3 IMPLANT
BNDG ESMARK 6X9 LF (GAUZE/BANDAGES/DRESSINGS) ×3
BOWL SMART MIX CTS (DISPOSABLE) ×3 IMPLANT
CAP KNEE TOTAL 3 SIGMA ×3 IMPLANT
CEMENT HV SMART SET (Cement) ×6 IMPLANT
CLOSURE WOUND 1/2 X4 (GAUZE/BANDAGES/DRESSINGS)
COVER SURGICAL LIGHT HANDLE (MISCELLANEOUS) ×3 IMPLANT
CUFF TOURNIQUET SINGLE 34IN LL (TOURNIQUET CUFF) ×3 IMPLANT
CUFF TOURNIQUET SINGLE 44IN (TOURNIQUET CUFF) IMPLANT
DRAPE EXTREMITY T 121X128X90 (DRAPE) ×3 IMPLANT
DRAPE IMP U-DRAPE 54X76 (DRAPES) ×3 IMPLANT
DRAPE U-SHAPE 47X51 STRL (DRAPES) ×3 IMPLANT
DRSG AQUACEL AG ADV 3.5X10 (GAUZE/BANDAGES/DRESSINGS) ×3 IMPLANT
DRSG MEPILEX BORDER 4X12 (GAUZE/BANDAGES/DRESSINGS) IMPLANT
DRSG PAD ABDOMINAL 8X10 ST (GAUZE/BANDAGES/DRESSINGS) ×3 IMPLANT
DURAPREP 26ML APPLICATOR (WOUND CARE) ×3 IMPLANT
ELECT CAUTERY BLADE 6.4 (BLADE) ×3 IMPLANT
ELECT REM PT RETURN 9FT ADLT (ELECTROSURGICAL) ×3
ELECTRODE REM PT RTRN 9FT ADLT (ELECTROSURGICAL) ×1 IMPLANT
EVACUATOR 1/8 PVC DRAIN (DRAIN) ×3 IMPLANT
FACESHIELD WRAPAROUND (MASK) ×3 IMPLANT
GAUZE SPONGE 4X4 12PLY STRL (GAUZE/BANDAGES/DRESSINGS) IMPLANT
GLOVE BIOGEL PI IND STRL 8 (GLOVE) ×2 IMPLANT
GLOVE BIOGEL PI INDICATOR 8 (GLOVE) ×4
GLOVE ECLIPSE 7.5 STRL STRAW (GLOVE) ×6 IMPLANT
GOWN STRL REUS W/ TWL LRG LVL3 (GOWN DISPOSABLE) ×1 IMPLANT
GOWN STRL REUS W/ TWL XL LVL3 (GOWN DISPOSABLE) ×2 IMPLANT
GOWN STRL REUS W/TWL LRG LVL3 (GOWN DISPOSABLE) ×2
GOWN STRL REUS W/TWL XL LVL3 (GOWN DISPOSABLE) ×4
HANDPIECE INTERPULSE COAX TIP (DISPOSABLE) ×2
HOOD PEEL AWAY FACE SHEILD DIS (HOOD) ×6 IMPLANT
IMMOBILIZER KNEE 20 (SOFTGOODS) ×3 IMPLANT
IMMOBILIZER KNEE 22 UNIV (SOFTGOODS) IMPLANT
KIT BASIN OR (CUSTOM PROCEDURE TRAY) ×3 IMPLANT
KIT ROOM TURNOVER OR (KITS) ×3 IMPLANT
MANIFOLD NEPTUNE II (INSTRUMENTS) ×3 IMPLANT
NEEDLE SPNL 22GX3.5 QUINCKE BK (NEEDLE) ×3 IMPLANT
NS IRRIG 1000ML POUR BTL (IV SOLUTION) ×3 IMPLANT
PACK TOTAL JOINT (CUSTOM PROCEDURE TRAY) ×3 IMPLANT
PACK UNIVERSAL I (CUSTOM PROCEDURE TRAY) ×3 IMPLANT
PAD ARMBOARD 7.5X6 YLW CONV (MISCELLANEOUS) ×6 IMPLANT
PAD CAST 4YDX4 CTTN HI CHSV (CAST SUPPLIES) ×1 IMPLANT
PADDING CAST COTTON 4X4 STRL (CAST SUPPLIES) ×2
SET HNDPC FAN SPRY TIP SCT (DISPOSABLE) ×1 IMPLANT
STAPLER VISISTAT 35W (STAPLE) IMPLANT
STRIP CLOSURE SKIN 1/2X4 (GAUZE/BANDAGES/DRESSINGS) IMPLANT
SUCTION FRAZIER HANDLE 10FR (MISCELLANEOUS) ×2
SUCTION TUBE FRAZIER 10FR DISP (MISCELLANEOUS) ×1 IMPLANT
SUT MNCRL AB 3-0 PS2 18 (SUTURE) IMPLANT
SUT VIC AB 0 CTB1 27 (SUTURE) ×6 IMPLANT
SUT VIC AB 1 CT1 27 (SUTURE) ×4
SUT VIC AB 1 CT1 27XBRD ANBCTR (SUTURE) ×2 IMPLANT
SUT VIC AB 2-0 CTB1 (SUTURE) ×6 IMPLANT
SYR 50ML LL SCALE MARK (SYRINGE) ×3 IMPLANT
TOWEL OR 17X24 6PK STRL BLUE (TOWEL DISPOSABLE) IMPLANT
TOWEL OR 17X26 10 PK STRL BLUE (TOWEL DISPOSABLE) ×3 IMPLANT
TRAY FOLEY CATH 16FRSI W/METER (SET/KITS/TRAYS/PACK) ×3 IMPLANT
WRAP KNEE MAXI GEL POST OP (GAUZE/BANDAGES/DRESSINGS) ×3 IMPLANT

## 2016-01-31 NOTE — Anesthesia Postprocedure Evaluation (Signed)
Anesthesia Post Note  Patient: Christina Green  Procedure(s) Performed: Procedure(s) (LRB): RIGHT TOTAL KNEE ARTHROPLASTY (Right)  Patient location during evaluation: PACU Anesthesia Type: Spinal Level of consciousness: awake and alert and oriented Pain management: pain level controlled Vital Signs Assessment: post-procedure vital signs reviewed and stable Respiratory status: spontaneous breathing, nonlabored ventilation, respiratory function stable and patient connected to nasal cannula oxygen Cardiovascular status: blood pressure returned to baseline and stable Postop Assessment: no backache, spinal receding, patient able to bend at knees and no signs of nausea or vomiting Anesthetic complications: no    Last Vitals:  Filed Vitals:   01/31/16 1130 01/31/16 1145  BP: 136/56 142/53  Pulse: 55 57  Temp:    Resp: 22 27    Last Pain:  Filed Vitals:   01/31/16 1149  PainSc: 5                  Larcenia Holaday A.

## 2016-01-31 NOTE — Brief Op Note (Signed)
**Note Christina-Identified via Obfuscation** 01/31/2016  2:25 PM  PATIENT:  Christina Green  82 y.o. female  PRE-OPERATIVE DIAGNOSIS:  RIGHT KNEE DEGENERATIVE JOINT DISEASE  POST-OPERATIVE DIAGNOSIS:  RIGHT KNEE DEGENERATIVE JOINT DISEASE  PROCEDURE:  Procedure(s): RIGHT TOTAL KNEE ARTHROPLASTY (Right)  SURGEON:  Surgeon(s) and Role:    * Dorna Leitz, MD - Primary  PHYSICIAN ASSISTANT:   ASSISTANTS: bethune   ANESTHESIA:   spinal  EBL:  Total I/O In: 1200 [I.V.:1200] Out: 350 [Urine:300; Blood:50]  BLOOD ADMINISTERED:none  DRAINS: none   LOCAL MEDICATIONS USED:  OTHER experel  SPECIMEN:  No Specimen  DISPOSITION OF SPECIMEN:  N/A  COUNTS:  YES  TOURNIQUET:   Total Tourniquet Time Documented: Thigh (Right) - 58 minutes Total: Thigh (Right) - 58 minutes   DICTATION: .Other Dictation: Dictation Number [id number]  PLAN OF CARE: Admit to inpatient   PATIENT DISPOSITION:  PACU - hemodynamically stable.   Delay start of Pharmacological VTE agent (>24hrs) due to surgical blood loss or risk of bleeding: no

## 2016-01-31 NOTE — Discharge Instructions (Signed)

## 2016-01-31 NOTE — Care Management Note (Addendum)
Case Management Note  Patient Details  Name: Christina Green MRN: [id number] Date of Birth: 19-Jan-1927  Subjective/Objective: 82 yr old female s/p right total knee arthroplasty.                   Action/Plan: Patient was preoperatively setup with [facility]. Wants SNF for shortterm rehab, patient wants [facility]. Social worker was notified. Case manager will continue to monitor.   Expected Discharge Date:                  Expected Discharge Plan:     In-House Referral:  Clinical Social Work  Discharge planning Services  CM Consult  Post Acute Care Choice:    Choice offered to:     DME Arranged:  N/A DME Agency:  NA  HH Arranged:    [place] Agency:  [facility]  Status of Service:  In process, will continue to follow  Medicare Important Message Given:    Date Medicare IM Given:    Medicare IM give by:    Date Additional Medicare IM Given:    Additional Medicare Important Message give by:     If discussed at [place] of Stay Meetings, dates discussed:    Additional Comments:  Ninfa Meeker, RN 01/31/2016, 2:20 PM

## 2016-01-31 NOTE — Evaluation (Signed)
Physical Therapy Evaluation Patient Details Name: Christina Green MRN: [id number] DOB: 09-14-1927 Today's Date: 01/31/2016   History of Present Illness  Pt admitted 3/6 for elective R TKA. Pt had L TKA 09/2016.  Clinical Impression  Pt is s/p TKA resulting in the deficits listed below (see PT Problem List). Pt tolerated mobility well for first time OOB and suspect will do great in rehab as she is motivated. Pt will benefit from skilled PT to increase their independence and safety with mobility to allow discharge to the venue listed below.      Follow Up Recommendations SNF;Supervision/Assistance - 24 hour (pt desires [facility])    Equipment Recommendations  None recommended by PT    Recommendations for Other Services       Precautions / Restrictions Precautions Precautions: Fall;Knee Precaution Booklet Issued: Yes (comment) Precaution Comments: began exercises Required Braces or Orthoses: Knee Immobilizer - Right Knee Immobilizer - Right: On when out of bed or walking Restrictions Weight Bearing Restrictions: Yes RLE Weight Bearing: Weight bearing as tolerated      Mobility  Bed Mobility Overal bed mobility: Needs Assistance Bed Mobility: Supine to Sit     Supine to sit: Min assist     General bed mobility comments: assist to bring hips to edge of bed, pt able to bring LEs off EOB  Transfers Overall transfer level: Needs assistance Equipment used: Rolling walker (2 wheeled) Transfers: Sit to/from Stand Sit to Stand: Min assist         General transfer comment: v/c's for safe hand placement  Ambulation/Gait             General Gait Details: pt took 5 steps to chair with KI on and use of RW and min guard  Stairs            Wheelchair Mobility    Modified Rankin (Stroke Patients Only)       Balance Overall balance assessment:  (needs RW for safe ambulation at this time)                                            Pertinent Vitals/Pain Pain Assessment: 0-10 Pain Score: 5  Pain Location: R knee Pain Descriptors / Indicators: Aching Pain Intervention(s): RN gave pain meds during session    Home Living Family/patient expects to be discharged to:: Skilled nursing facility Living Arrangements: Alone               Additional Comments: pt active and indep with AD PTA    Prior Function Level of Independence: Independent         Comments: drives, drives 4-wheelers     Hand Dominance   Dominant Hand: Right    Extremity/Trunk Assessment   Upper Extremity Assessment: Overall WFL for tasks assessed           Lower Extremity Assessment: RLE deficits/detail RLE Deficits / Details: pt able to initiate quad set    Cervical / Trunk Assessment: Normal  Communication   Communication: No difficulties  Cognition Arousal/Alertness: Awake/alert Behavior During Therapy: WFL for tasks assessed/performed Overall Cognitive Status: Within Functional Limits for tasks assessed                      General Comments      Exercises        Assessment/Plan    PT  Assessment Patient needs continued PT services  PT Diagnosis Difficulty walking;Generalized weakness;Acute pain   PT Problem List Decreased strength;Decreased range of motion;Decreased activity tolerance;Decreased balance;Decreased mobility;Decreased knowledge of use of DME  PT Treatment Interventions DME instruction;Gait training;Functional mobility training;Therapeutic activities;Therapeutic exercise   PT Goals (Current goals can be found in the Care Plan section) Acute Rehab PT Goals Patient Stated Goal: rehab and home in 2 weeks PT Goal Formulation: With patient Time For Goal Achievement: 02/07/16 Potential to Achieve Goals: Good    Frequency 7X/week   Barriers to discharge Decreased caregiver support lives alone    Co-evaluation               End of Session Equipment Utilized During Treatment:  Gait belt;Right knee immobilizer Activity Tolerance: Patient tolerated treatment well Patient left: in chair;with call bell/phone within reach Nurse Communication: Mobility status         Time: [id number] PT Time Calculation (min) (ACUTE ONLY): 22 min   Charges:   PT Evaluation $PT Eval Moderate Complexity: 1 Procedure     PT G CodesKingsley Callander 01/31/2016, 4:32 PM   Kittie Plater, PT, DPT Pager #: [phone] Office #: [phone]

## 2016-01-31 NOTE — Anesthesia Procedure Notes (Addendum)
Spinal Patient location during procedure: OR Start time: 01/31/2016 9:10 AM Staffing Anesthesiologist: Josephine Igo Performed by: anesthesiologist  Preanesthetic Checklist Completed: patient identified, site marked, surgical consent, pre-op evaluation, timeout performed, IV checked, risks and benefits discussed and monitors and equipment checked Spinal Block Patient position: sitting Prep: site prepped and draped and DuraPrep Patient monitoring: heart rate, cardiac monitor, continuous pulse ox and blood pressure Approach: midline Location: L3-4 Injection technique: single-shot Needle Needle type: Pencan  Needle gauge: 24 G Needle length: 9 cm Needle insertion depth: 5 cm Assessment Sensory level: T6 Additional Notes Patient tolerated procedure well. Adequate sensory level.  Procedure Name: MAC Date/Time: 01/31/2016 8:58 AM Performed by: Salli Quarry Christina Green Pre-anesthesia Checklist: Patient identified, Emergency Drugs available, Suction available, Patient being monitored and Timeout performed Patient Re-evaluated:Patient Re-evaluated prior to inductionOxygen Delivery Method: Nasal cannula

## 2016-01-31 NOTE — Progress Notes (Signed)
Orthopedic Tech Progress Note Patient Details:  Christina Green 1927/10/01 [id number]  CPM Right Knee CPM Right Knee: On Right Knee Flexion (Degrees): 60 Right Knee Extension (Degrees): 0 Additional Comments: Trapeze bar and foot roll   [place] Pink 01/31/2016, 11:47 AM

## 2016-01-31 NOTE — Transfer of Care (Signed)
Immediate Anesthesia Transfer of Care Note  Patient: Christina Green  Procedure(s) Performed: Procedure(s): RIGHT TOTAL KNEE ARTHROPLASTY (Right)  Patient Location: PACU  Anesthesia Type:MAC and Spinal  Level of Consciousness: awake, alert , oriented and patient cooperative  Airway & Oxygen Therapy: Patient Spontanous Breathing and Patient connected to nasal cannula oxygen  Post-op Assessment: Report given to RN and Post -op Vital signs reviewed and stable  Post vital signs: Reviewed and stable  Last Vitals: There were no vitals filed for this visit.  Complications: No apparent anesthesia complications

## 2016-01-31 NOTE — Progress Notes (Signed)
Utilization review completed.  

## 2016-01-31 NOTE — Op Note (Signed)
NAME:  Christina Green, Christina Green NO.:  [id number]  MEDICAL RECORD NO.:  [id number]  LOCATION:  5N06C                        FACILITY:  [facility]  PHYSICIAN:  Alta Corning, M.D.   DATE OF BIRTH:  1926/12/18  DATE OF PROCEDURE:  01/31/2016 DATE OF DISCHARGE:                              OPERATIVE REPORT   PREOPERATIVE DIAGNOSIS:  End-stage degenerative joint disease, right knee with bone-on-bone change.  POSTOPERATIVE DIAGNOSIS:  End-stage degenerative joint disease, right knee with bone-on-bone change.  PROCEDURE:  Right total knee replacement with a Sigma system, size 2.5 femur, size 2.5 tibia, 12.5 mm bridging bearing, and a 35 mm all- polyethylene patella.  SURGEON:  Alta Corning, M.D.  ASSISTANT:  Gary Fleet, PA.  ANESTHESIA:  General.  BRIEF HISTORY:  Christina Green is an 82 year old female, with a long history of significant complaints of bilateral knee pain.  She had a left total knee replacement and has done reasonably well.  Her right knee was beginning to have significant and severe limitations of motion and chronic pain.  X-ray showed bone-on-bone change.  She was having night pain and light activity pain and she failed all conservative care including injection, therapy, and activity modification.  The patient has taken to the operating for right total knee replacement.  PROCEDURE:  The patient was taken to the operating room.  After adequate anesthesia was obtained with general anesthetic, the patient was placed supine on the operating room table.  The right leg was prepped and draped in sterile fashion.  Following this, the leg was exsanguinated. Blood pressure tourniquet inflated to 300 mmHg.  Following this, a midline incision was made and subcutaneous tissue down all the extensor mechanism.  Medial parapatellar arthrotomy was undertaken.  The anterior posterior cruciates were removed, retropatellar fat pad, synovium anterior aspect of the femur,  and medial lateral meniscus.  Once this was done, attention was turned to the femur when intramedullary pilot hole was drilled and distal bone was resected at a 4-degree valgus inclination.  An 11 mm distal bone was resected.  Once this was done, attention was turned towards the femur, which was sized to a 2.5, anterior-posterior cuts were made, chamfers and box.  Attention then turned to the tibia, which was cut perpendicular to its long axis. Spacer blocks attempt to be put in place at this point, but there is really an ability to get a spacer block in.  We then removed the anterior posterior cuts and chamfers and box from the femur.  Once this was done, I really opened up the gap and at this point, the extension gap was still too tight.  The flexion gap was satisfactory for a 10.  At this point, we went back to the femur, put the intramedullary pilot guide back in and took 3 more mm of distal bone and had to re-do the chamfers.  Once that was done, excellent stability and balance were achieved at this point.  Following this, attention turned to placing the trial components 2.5 femur, 2.5 tibia.  After the tibia was cut perpendicular to its long axis and the tibial base plate was applied and it was drilled and keeled.  A  10 mm bridging bearing was put in place. The tibia was cut down to a level of 14 mm and a 35 all poly patella was placed at this point.  After lugs were drilled to the paddle, trial range of motion was excellent.  At this point, the trial components were removed.  The knee was copiously and thoroughly with pulsatile lavage irrigation and the final components were then cemented into place after drying the knee thoroughly a 2.5 tibia, 2.5 femur, a 10 mm bridging bearing trial was placed and a 35 all poly patella was placed, held with a clamp.  All excess bone cement removed and the tourniquet was let down.  Once that is cement was completely hardened.  All  bleeding controlled electrocautery.  A 20 mL Exparel with 0.5% Marcaine 20 mL were mixed instilled all throughout the knee for postoperative anesthesia.  Following this, attention turned towards removing the trial 10.  All bleedings controlled with electrocautery after the tourniquet was let down.  We tried a 12.5, which I thought gave a little bit better stability.  She was tight preoperatively and were concerned about that, but I thought the 12.5 get better stability with the 12.5 poly final and at this point, the knee was irrigated suctioned dry.  The medial parapatellar arthrotomy was closed with 1 Vicryl running, skin with 0 and 2-0 Vicryl, and skin staples on the skin.  Sterile compressive dressing was applied.  The patient was taken to the recovery to be in satisfactory condition.  Estimated blood loss for procedure was minimal.     Alta Corning, M.D.     Corliss Skains  D:  01/31/2016  T:  01/31/2016  Job:  [id number]

## 2016-01-31 NOTE — NC FL2 (Signed)
[place] LEVEL OF CARE SCREENING TOOL     IDENTIFICATION  Patient Name: Christina Green Birthdate: 04/21/1927 Sex: female Admission Date (Current Location): 01/31/2016  [facility] and [place] Number:  Herbalist and Address:  [facility] [place], [place] [address] [place], [place] [address]      Provider Number: [id number]  Attending Physician Name and Address:  Dorna Leitz, MD  Relative Name and Phone Number:       Current Level of Care: Hospital Recommended Level of Care: [place] Prior Approval Number:    Date Approved/Denied:   PASRR Number: [id number]  Discharge Plan: SNF    Current Diagnoses: Patient Active Problem List   Diagnosis Date Noted  . Primary osteoarthritis of right knee 01/31/2016    Orientation RESPIRATION BLADDER Height & Weight     Self, Time, Situation, Place  O2 (2L)  (please see dc summary as patient has cath on unit poss dc prior to discharge) Weight:   Height:     BEHAVIORAL SYMPTOMS/MOOD NEUROLOGICAL BOWEL NUTRITION STATUS       (please see dc summary for continence) Diet (please see dc summary for dietary needs)  AMBULATORY STATUS COMMUNICATION OF NEEDS Skin   Limited Assist Verbally Surgical wounds                       Personal Care Assistance Level of Assistance  Dressing, Bathing Bathing Assistance: Independent Feeding assistance: Limited assistance Dressing Assistance: Limited assistance     Functional Limitations Info             SPECIAL CARE FACTORS FREQUENCY  PT (By licensed PT), OT (By licensed OT)     PT Frequency: daily OT Frequency: daily            Contractures Contractures Info: Not present    Additional Factors Info  Allergies   Allergies Info: penicillins           Current Medications (01/31/2016):  This is the current hospital active medication list Current Facility-Administered Medications  Medication Dose Route Frequency Provider Last  Rate Last Dose  . 0.9 %  sodium chloride infusion   Intravenous Continuous Gary Fleet, PA-C      . acetaminophen (TYLENOL) tablet 650 mg  650 mg Oral Q6H PRN Gary Fleet, PA-C       Or  . acetaminophen (TYLENOL) suppository 650 mg  650 mg Rectal Q6H PRN Gary Fleet, PA-C      . alum & mag hydroxide-simeth (MAALOX/MYLANTA) 200-200-20 MG/5ML suspension 30 mL  30 mL Oral Q4H PRN Gary Fleet, PA-C      . amLODipine (NORVASC) tablet 5 mg  5 mg Oral Daily Gary Fleet, PA-C      . aspirin EC tablet 325 mg  325 mg Oral BID PC Gary Fleet, PA-C      . atorvastatin (LIPITOR) tablet 40 mg  40 mg Oral Daily Gary Fleet, PA-C      . bisacodyl (DULCOLAX) EC tablet 5 mg  5 mg Oral Daily PRN Gary Fleet, PA-C      . clindamycin (CLEOCIN) IVPB 600 mg  600 mg Intravenous Q6H Gary Fleet, PA-C      . dexamethasone (DECADRON) injection 10 mg  10 mg Intravenous Q12H Gary Fleet, PA-C      . diphenhydrAMINE (BENADRYL) 12.5 MG/5ML elixir 12.5-25 mg  12.5-25 mg Oral Q4H PRN Gary Fleet, PA-C      . docusate sodium (COLACE)  capsule 100 mg  100 mg Oral BID Gary Fleet, PA-C      . DULoxetine (CYMBALTA) DR capsule 60 mg  60 mg Oral Daily Gary Fleet, PA-C      . fentaNYL (SUBLIMAZE) 100 MCG/2ML injection           . losartan (COZAAR) tablet 100 mg  100 mg Oral Daily Dorna Leitz, MD       And  . hydrochlorothiazide (MICROZIDE) capsule 12.5 mg  12.5 mg Oral Daily Dorna Leitz, MD      . HYDROcodone-acetaminophen [facility]) 10-325 MG per tablet 1-2 tablet  1-2 tablet Oral Q4H PRN Gary Fleet, PA-C   0 tablet at 01/31/16 1247  . HYDROmorphone (DILAUDID) injection 0.5 mg  0.5 mg Intravenous Q3H PRN Gary Fleet, PA-C      . labetalol (NORMODYNE) tablet 200 mg  200 mg Oral BID Gary Fleet, PA-C      . magnesium citrate solution 1 Bottle  1 Bottle Oral Once PRN Gary Fleet, PA-C      . methocarbamol (ROBAXIN) tablet 500 mg  500 mg Oral Q6H PRN Gary Fleet, PA-C       Or  . methocarbamol  (ROBAXIN) 500 mg in dextrose 5 % 50 mL IVPB  500 mg Intravenous Q6H PRN Gary Fleet, PA-C      . ondansetron [facility]) tablet 4 mg  4 mg Oral Q6H PRN Gary Fleet, PA-C       Or  . ondansetron [facility]) injection 4 mg  4 mg Intravenous Q6H PRN Gary Fleet, PA-C      . polyethylene glycol (MIRALAX / GLYCOLAX) packet 17 g  17 g Oral Daily PRN Gary Fleet, PA-C      . promethazine (PHENERGAN) injection 6.25 mg  6.25 mg Intravenous Q6H PRN Gary Fleet, PA-C      . tranexamic acid (CYKLOKAPRON) 1,000 mg in sodium chloride 0.9 % 100 mL IVPB  1,000 mg Intravenous Once Gary Fleet, PA-C      . zolpidem (AMBIEN) tablet 5 mg  5 mg Oral QHS PRN Gary Fleet, PA-C         Discharge Medications: Please see discharge summary for a list of discharge medications.  Relevant Imaging Results:  Relevant Lab Results:   Additional Information SSN: [id number]  Dulcy Fanny, LCSW

## 2016-02-01 ENCOUNTER — Encounter (HOSPITAL_COMMUNITY): Payer: Self-pay | Admitting: Orthopedic Surgery

## 2016-02-01 LAB — CBC
HCT: 29.5 % — ABNORMAL LOW (ref 36.0–46.0)
Hemoglobin: 10.2 g/dL — ABNORMAL LOW (ref 12.0–15.0)
MCH: 31.9 pg (ref 26.0–34.0)
MCHC: 34.6 g/dL (ref 30.0–36.0)
MCV: 92.2 fL (ref 78.0–100.0)
Platelets: 252 10*3/uL (ref 150–400)
RBC: 3.2 MIL/uL — ABNORMAL LOW (ref 3.87–5.11)
RDW: 12.6 % (ref 11.5–15.5)
WBC: 11 10*3/uL — AB (ref 4.0–10.5)

## 2016-02-01 LAB — BASIC METABOLIC PANEL
ANION GAP: 13 (ref 5–15)
BUN: 11 mg/dL (ref 6–20)
BUN: 11 mg/dL (ref 4–21)
CALCIUM: 8.3 mg/dL — AB (ref 8.9–10.3)
CO2: 26 mmol/L (ref 22–32)
CREATININE: 0.86 mg/dL (ref 0.44–1.00)
Chloride: 98 mmol/L — ABNORMAL LOW (ref 101–111)
Creatinine: 0.9 mg/dL (ref 0.5–1.1)
GFR, EST NON AFRICAN AMERICAN: 59 mL/min — AB (ref >60)
Glucose, Bld: 212 mg/dL — ABNORMAL HIGH (ref 65–99)
Glucose: 212 mg/dL
Potassium: 3.6 mmol/L (ref 3.5–5.1)
SODIUM: 137 mmol/L (ref 137–147)
SODIUM: 137 mmol/L (ref 135–145)

## 2016-02-01 NOTE — Progress Notes (Signed)
**Note Christina-Identified via Obfuscation** Orthopedic Tech Progress Note Patient Details:  Christina Green May 20, 1927 [id number]  Patient ID: Christina Green, female   DOB: 11/16/1927, 82 y.o.   MRN: [id number] Applied cpm 0-60  Christina Green 02/01/2016, 7:41 PM

## 2016-02-01 NOTE — Progress Notes (Signed)
**Note Christina-Identified via Obfuscation** Orthopedic Tech Progress Note Patient Details:  [name] 03-15-1927 [id number]  Patient ID: Christina Green, female   DOB: 11-07-1927, 82 y.o.   MRN: [id number] Applied cpm 0-60  Christina Green 02/01/2016, 5:36 AM

## 2016-02-01 NOTE — Progress Notes (Signed)
Physical Therapy Treatment Patient Details Name: Christina Green MRN: [id number] DOB: 1927-07-13 Today's Date: 02/01/2016    History of Present Illness Pt admitted 3/6 for elective R TKA. Pt had L TKA 09/2016.    PT Comments    Patient progressing well toward mobility goals and tolerated exercises well. Continue to progress as tolerated.   Follow Up Recommendations  SNF;Supervision/Assistance - 24 hour     Equipment Recommendations  None recommended by PT    Recommendations for Other Services       Precautions / Restrictions Precautions Precautions: Fall;Knee Precaution Booklet Issued: Yes (comment) Required Braces or Orthoses: Knee Immobilizer - Right Knee Immobilizer - Right: On when out of bed or walking Restrictions Weight Bearing Restrictions: Yes RLE Weight Bearing: Weight bearing as tolerated    Mobility  Bed Mobility Overal bed mobility: Needs Assistance Bed Mobility: Supine to Sit     Supine to sit: Min guard     General bed mobility comments: cues for technique; increased time needed; HOB elevated 30 degrees and min use of bedrails  Transfers Overall transfer level: Needs assistance Equipment used: Rolling walker (2 wheeled) Transfers: Sit to/from Stand Sit to Stand: Min assist         General transfer comment: cues for hand placement and technique; pt with c/o dizziness upon standin; educated on breathing technique and pt reported dizziness subsided after a few deep breaths  Ambulation/Gait Ambulation/Gait assistance: Min guard Ambulation Distance (Feet): 75 Feet Assistive device: Rolling walker (2 wheeled) Gait Pattern/deviations: Step-through pattern;Decreased stride length;Decreased stance time - right;Decreased step length - left;Antalgic     General Gait Details: slow but steady gait with no knee buckling noted; cues for posture, sequencing, and position of RW   Stairs            Wheelchair Mobility    Modified Rankin (Stroke  Patients Only)       Balance Overall balance assessment: Needs assistance Sitting-balance support: No upper extremity supported;Feet supported Sitting balance-Leahy Scale: Good     Standing balance support: Bilateral upper extremity supported Standing balance-Leahy Scale: Poor Standing balance comment: pt a littel unsteady with turns still; needs RW for support                    Cognition Arousal/Alertness: Awake/alert Behavior During Therapy: WFL for tasks assessed/performed Overall Cognitive Status: Within Functional Limits for tasks assessed                      Exercises Total Joint Exercises Quad Sets: AROM;Right;10 reps;Seated Heel Slides: AROM;Right;10 reps;Seated Goniometric ROM: 0-65    General Comments        Pertinent Vitals/Pain Pain Assessment: 0-10 Pain Score: 3  Pain Location: R knee Pain Descriptors / Indicators: Sore Pain Intervention(s): Monitored during session;Premedicated before session;Repositioned    Home Living                      Prior Function            PT Goals (current goals can now be found in the care plan section) Acute Rehab PT Goals Patient Stated Goal: rehab and home in 2 weeks PT Goal Formulation: With patient Time For Goal Achievement: 02/07/16 Potential to Achieve Goals: Good Progress towards PT goals: Progressing toward goals    Frequency  7X/week    PT Plan Current plan remains appropriate    Co-evaluation  End of Session Equipment Utilized During Treatment: Gait belt;Right knee immobilizer Activity Tolerance: Patient tolerated treatment well Patient left: in chair;with call bell/phone within reach     Time: 1016-1049 PT Time Calculation (min) (ACUTE ONLY): 33 min  Charges:  $Gait Training: 8-22 mins $Therapeutic Exercise: 8-22 mins                    G Codes:      Salina April, PTA Pager: [phone]   02/01/2016, 11:09 AM

## 2016-02-01 NOTE — Progress Notes (Signed)
Subjective: 1 Day Post-Op Procedure(s) (LRB): RIGHT TOTAL KNEE ARTHROPLASTY (Right) Patient reports pain as moderate.  Overall knee feels good. Taking by mouth without difficulty.  Objective: Vital signs in last 24 hours: Temp:  [97.1 F (36.2 C)-98.2 F (36.8 C)] 97.5 F (36.4 C) (03/07 0524) Pulse Rate:  [51-68] 62 (03/07 0524) Resp:  [15-27] 18 (03/07 0524) BP: (102-185)/(46-118) 139/65 mmHg (03/07 0524) SpO2:  [95 %-100 %] 98 % (03/07 0524)  Intake/Output from previous day: 03/06 0701 - 03/07 0700 In: 1846.7 [I.V.:1746.7; IV Piggyback:100] Out: 1600 [Urine:1550; Blood:50] Intake/Output this shift:     Recent Labs  02/01/16 0534  HGB 10.2*    Recent Labs  02/01/16 0534  WBC 11.0*  RBC 3.20*  HCT 29.5*  PLT 252    Recent Labs  02/01/16 0534  NA 137  K 3.6  CL 98*  CO2 26  BUN 11  CREATININE 0.86  GLUCOSE 212*  CALCIUM 8.3*   No results for input(s): LABPT, INR in the last 72 hours. Right knee exam: Neurovascular intact Intact pulses distally Dorsiflexion/Plantar flexion intact Incision: dressing C/D/I No cellulitis present Compartment soft  Assessment/Plan: 1 Day Post-Op Procedure(s) (LRB): RIGHT TOTAL KNEE ARTHROPLASTY (Right) Plan: Aspirin 325 mg twice daily with SCDs for DVT prophylaxis. Weight-bear as tolerated on the right. Up with therapy Discharge to SNF in a couple of days.  Christina Green 02/01/2016, 9:50 AM

## 2016-02-02 DIAGNOSIS — D62 Acute posthemorrhagic anemia: Secondary | ICD-10-CM

## 2016-02-02 LAB — CBC
HCT: 23.7 % — ABNORMAL LOW (ref 36.0–46.0)
Hemoglobin: 8.1 g/dL — ABNORMAL LOW (ref 12.0–15.0)
MCH: 31.9 pg (ref 26.0–34.0)
MCHC: 34.2 g/dL (ref 30.0–36.0)
MCV: 93.3 fL (ref 78.0–100.0)
PLATELETS: 230 10*3/uL (ref 150–400)
RBC: 2.54 MIL/uL — AB (ref 3.87–5.11)
RDW: 13 % (ref 11.5–15.5)
WBC: 12 10*3/uL — ABNORMAL HIGH (ref 4.0–10.5)

## 2016-02-02 MED ORDER — FERROUS SULFATE 325 (65 FE) MG PO TABS
325.0000 mg | ORAL_TABLET | Freq: Two times a day (BID) | ORAL | Status: DC
  Administered 2016-02-02 – 2016-02-03 (×2): 325 mg via ORAL
  Filled 2016-02-02 (×2): qty 1

## 2016-02-02 NOTE — Progress Notes (Signed)
**Note Christina-Identified via Obfuscation** Orthopedic Tech Progress Note Patient Details:  [name] 08-Oct-1927 [id number]  Patient ID: Christina Green, female   DOB: [birth year], 82 y.o.   MRN: [id number] Applied cpm 0-50  Christina Green 02/02/2016, 6:13 AM

## 2016-02-02 NOTE — Progress Notes (Signed)
Subjective: 2 Days Post-Op Procedure(s) (LRB): RIGHT TOTAL KNEE ARTHROPLASTY (Right) Patient reports pain as moderate. The patient is taking by mouth and voiding okay. She is sitting up in a chair comfortably. Denies dizziness or lightheadedness.    Objective: Vital signs in last 24 hours: Temp:  [97.6 F (36.4 C)-98.2 F (36.8 C)] 97.6 F (36.4 C) (03/08 [id number]) Pulse Rate:  [62-71] 62 (03/08 0608) Resp:  [16-18] 16 (03/08 0608) BP: (118-139)/(50-52) 118/50 mmHg (03/08 0608) SpO2:  [96 %-98 %] 98 % (03/08 0608)  Intake/Output from previous day: 03/07 0701 - 03/08 0700 In: 540 [P.O.:540] Out: -  Intake/Output this shift: Total I/O In: 240 [P.O.:240] Out: -    Recent Labs  02/01/16 0534 02/02/16 0445  HGB 10.2* 8.1*    Recent Labs  02/01/16 0534 02/02/16 0445  WBC 11.0* 12.0*  RBC 3.20* 2.54*  HCT 29.5* 23.7*  PLT 252 230    Recent Labs  02/01/16 0534  NA 137  K 3.6  CL 98*  CO2 26  BUN 11  CREATININE 0.86  GLUCOSE 212*  CALCIUM 8.3*   No results for input(s): LABPT, INR in the last 72 hours. Right knee exam: Dressing clean and dry. Calf soft and nontender. And V intact distally. Distal pulses are 1+. Active ankle plantar and dorsiflexors.   Assessment/Plan: 2 Days Post-Op Procedure(s) (LRB): RIGHT TOTAL KNEE ARTHROPLASTY (Right) Acute blood loss anemia, expected. Plan: Start iron 325 mg daily. Continue aspirin 325 mg twice daily with SCDs for DVT prophylaxis. Continue physical therapy. Weight-bear as tolerated on right. Plan discharge to skilled nursing facility tomorrow.   Yoel Kaufhold G 02/02/2016, 10:30 AM

## 2016-02-02 NOTE — Progress Notes (Addendum)
Physical Therapy Treatment Patient Details Name: Christina Green MRN: [id number] DOB: 03-Oct-1927 Today's Date: 02/02/2016    History of Present Illness Pt admitted 3/6 for elective R TKA. Pt had L TKA 09/2016.    PT Comments    Patient continues to do well with PT but needs cues for safe mobility and use of AD. Pt demonstrated wheezing and SOB that she did not experience during previous session. Pt educated on pursed lip breathing.  Pt reported it felt difficult to catch her breath once while in supine at beginning of session and once while ambulating. SpO2 remained 94-97% on RA throughout session. RN aware.    Follow Up Recommendations  SNF;Supervision/Assistance - 24 hour     Equipment Recommendations  None recommended by PT    Recommendations for Other Services       Precautions / Restrictions Precautions Precautions: Fall;Knee Precaution Booklet Issued: Yes (comment) Required Braces or Orthoses: Knee Immobilizer - Right Knee Immobilizer - Right: On when out of bed or walking Restrictions Weight Bearing Restrictions: Yes RLE Weight Bearing: Weight bearing as tolerated    Mobility  Bed Mobility Overal bed mobility: Needs Assistance Bed Mobility: Supine to Sit     Supine to sit: Min guard     General bed mobility comments: cues for technique and increased time needed  Transfers Overall transfer level: Needs assistance Equipment used: Rolling walker (2 wheeled) Transfers: Sit to/from Stand Sit to Stand: Min guard;Min assist         General transfer comment: from EOB X 2; cues for safe hand placement first trial; good technique; lightheadedness upon standing; SpO2 95-97% on RA  Ambulation/Gait Ambulation/Gait assistance: Min guard Ambulation Distance (Feet): 60 Feet Assistive device: Rolling walker (2 wheeled) Gait Pattern/deviations: Step-through pattern;Decreased stride length;Antalgic     General Gait Details: cues for position of RW and bilat heel  strike; pt with improved bilat step symmetry and step length with cues   Stairs            Wheelchair Mobility    Modified Rankin (Stroke Patients Only)       Balance Overall balance assessment: Needs assistance   Sitting balance-Leahy Scale: Good     Standing balance support: Bilateral upper extremity supported Standing balance-Leahy Scale: Fair                      Cognition Arousal/Alertness: Awake/alert Behavior During Therapy: WFL for tasks assessed/performed Overall Cognitive Status: Within Functional Limits for tasks assessed                      Exercises Total Joint Exercises Quad Sets: AROM;10 reps;Both;Supine Heel Slides: AROM;Right;10 reps;Supine Hip ABduction/ADduction: AROM;Right;10 reps;Supine Long Arc Quad: AROM;Right;10 reps;Seated Goniometric ROM: 0-85    General Comments General comments (skin integrity, edema, etc.): pt with weezing and SOB throughtout session; SpO2 remained 94-97% throughout session on RA; pt with difficulty catching breath while supine at beginning of session and while ambulating; educated on pursed lip breathing technique; RN notified of pt wheezing, SOB, and congestion      Pertinent Vitals/Pain Pain Assessment: Faces Faces Pain Scale: Hurts little more Pain Location: R knee during therex Pain Descriptors / Indicators: Sore Pain Intervention(s): Monitored during session;Repositioned    Home Living                      Prior Function            PT  Goals (current goals can now be found in the care plan section) Acute Rehab PT Goals Patient Stated Goal: rehab and home in 2 weeks PT Goal Formulation: With patient Time For Goal Achievement: 02/07/16 Potential to Achieve Goals: Good Progress towards PT goals: Progressing toward goals    Frequency  7X/week    PT Plan Current plan remains appropriate    Co-evaluation             End of Session Equipment Utilized During Treatment:  Gait belt Activity Tolerance: Patient tolerated treatment well Patient left: in chair;with call bell/phone within reach     Time: 1526-1606 PT Time Calculation (min) (ACUTE ONLY): 40 min  Charges:  $Gait Training: 8-22 mins $Therapeutic Exercise: 8-22 mins $Therapeutic Activity: 8-22 mins                    G Codes:      Salina April, PTA Pager: [phone]   02/02/2016, 4:27 PM

## 2016-02-02 NOTE — Progress Notes (Signed)
CSW provided pt with bed offers- pt chooses [facility]  CSW confirmed with Miquel Dunn they will be able to accept pt- anticipate DC tomorrow  CSW will continue to follow  Domenica Reamer, Will Social Worker [phone]

## 2016-02-03 LAB — CBC
HCT: 23.1 % — ABNORMAL LOW (ref 36.0–46.0)
HEMOGLOBIN: 8 g/dL — AB (ref 12.0–15.0)
MCH: 32.1 pg (ref 26.0–34.0)
MCHC: 34.6 g/dL (ref 30.0–36.0)
MCV: 92.8 fL (ref 78.0–100.0)
Platelets: 224 10*3/uL (ref 150–400)
RBC: 2.49 MIL/uL — ABNORMAL LOW (ref 3.87–5.11)
RDW: 13.1 % (ref 11.5–15.5)
WBC: 8.2 10*3/uL (ref 4.0–10.5)

## 2016-02-03 LAB — CBC AND DIFFERENTIAL: WBC: 8.2 10^3/mL

## 2016-02-03 MED ORDER — FERROUS SULFATE 325 (65 FE) MG PO TABS: 325.0000 mg | ORAL_TABLET | Freq: Two times a day (BID) | ORAL | Status: DC

## 2016-02-03 NOTE — Progress Notes (Signed)
**Note Christina-Identified via Obfuscation** Orthopedic Tech Progress Note Patient Details:  Christina Green Dec 07, 1926 [id number]  Patient ID: Christina Green, female   DOB: 1927-08-07, 82 y.o.   MRN: [id number] Applied cpm 0-50  Karolee Stamps 02/03/2016, 5:27 AM

## 2016-02-03 NOTE — Clinical Social Work Note (Signed)
BSW intern has arranged transport via [place] intern has met with patient to make her aware. Patient informed BSW intern that she would contact family to update them on discharge plan.   BSW intern is signing off. If any further Social Work needs arises, please re-consult.  [place] intern [phone]

## 2016-02-03 NOTE — Progress Notes (Signed)
Subjective: 3 Days Post-Op Procedure(s) (LRB): RIGHT TOTAL KNEE ARTHROPLASTY (Right) Patient reports pain as mild.  Making progress with physical therapy.  No chest pain or shortness of breath.  No dizziness.  Taking by mouth and voiding okay.  Objective: Vital signs in last 24 hours: Temp:  [97.5 F (36.4 C)-98.1 F (36.7 C)] 97.5 F (36.4 C) (03/09 0342) Pulse Rate:  [58-76] 76 (03/09 0342) Resp:  [16-18] 18 (03/09 0342) BP: (103-141)/(45-52) 141/52 mmHg (03/09 0342) SpO2:  [92 %-98 %] 95 % (03/09 0342)  Intake/Output from previous day: 03/08 0701 - 03/09 0700 In: 720 [P.O.:720] Out: -  Intake/Output this shift: Total I/O In: 120 [P.O.:120] Out: -    Recent Labs  02/01/16 0534 02/02/16 0445 02/03/16 0457  HGB 10.2* 8.1* 8.0*    Recent Labs  02/02/16 0445 02/03/16 0457  WBC 12.0* 8.2  RBC 2.54* 2.49*  HCT 23.7* 23.1*  PLT 230 224    Recent Labs  02/01/16 0534  NA 137  K 3.6  CL 98*  CO2 26  BUN 11  CREATININE 0.86  GLUCOSE 212*  CALCIUM 8.3*   No results for input(s): LABPT, INR in the last 72 hours. Right knee exam: Neurovascular intact Sensation intact distally Intact pulses distally Dorsiflexion/Plantar flexion intact Incision: dressing C/D/I Compartment soft  Assessment/Plan: 3 Days Post-Op Procedure(s) (LRB): RIGHT TOTAL KNEE ARTHROPLASTY (Right)  Acute blood loss anemia, expected.  Asymptomatic. Plan: Oral iron therapy. Up with therapy Discharge to SNF followup with Dr. Berenice Primas in 2 weeks. Sarabeth Benton G 02/03/2016, 8:55 AM

## 2016-02-03 NOTE — Clinical Social Work Placement (Signed)
   CLINICAL SOCIAL WORK PLACEMENT  NOTE  Date:  02/03/2016  Patient Details  Name: Christina Green MRN: [id number] Date of Birth: Apr 14, 1927  Clinical Social Work is seeking post-discharge placement for this patient at the [place] level of care (*CSW will initial, date and re-position this form in  chart as items are completed):  Yes   Patient/family provided with [place] Work Department's list of facilities offering this level of care within the geographic area requested by the patient (or if unable, by the patient's family).  Yes   Patient/family informed of their freedom to choose among providers that offer the needed level of care, that participate in Medicare, Medicaid or managed care program needed by the patient, have an available bed and are willing to accept the patient.  Yes   Patient/family informed of 's ownership interest in [facility] and [facility], as well as of the fact that they are under no obligation to receive care at these facilities.  PASRR submitted to EDS on 01/31/16     PASRR number received on 01/31/16     Existing PASRR number confirmed on       FL2 transmitted to all facilities in geographic area requested by pt/family on 01/31/16     FL2 transmitted to all facilities within larger geographic area on       Patient informed that his/her managed care company has contracts with or will negotiate with certain facilities, including the following:        Yes   Patient/family informed of bed offers received.  Patient chooses bed at  [facility] [place] )     Physician recommends and patient chooses bed at      Patient to be transferred to  [facility] [place]) on 02/03/16.  Patient to be transferred to facility by  [facility] )     Patient family notified on 02/03/16 of transfer.  Name of family member notified:   (Pt's dtr, Vaughan Basta )     PHYSICIAN Please prepare priority  discharge summary, including medications     Additional Comment:    _______________________________________________ Rozell Searing, LCSW 02/03/2016, 10:27 AM

## 2016-02-03 NOTE — Care Management Important Message (Signed)
Important Message  Patient Details  Name: Christina Green MRN: [id number] Date of Birth: 09-19-1927   Medicare Important Message Given:  Yes    [name] [name] 02/03/2016, 11:43 AM

## 2016-02-03 NOTE — Progress Notes (Signed)
Report given to [facility]. 

## 2016-02-03 NOTE — Discharge Summary (Signed)
Patient ID: Christina Green MRN: [id number] DOB/AGE: [birth year] 82 y.o.  Admit date: 01/31/2016 Discharge date: 02/03/2016  Admission Diagnoses:  Principal Problem:   Primary osteoarthritis of right knee Active Problems:   Acute blood loss anemia   Discharge Diagnoses:  Same  Past Medical History  Diagnosis Date  . Hyperlipemia     takes Atorvastatin daily  . DJD (degenerative joint disease) of knee     and hands  . Graves disease   . Hypertension     takes Hyzaar and Labetalol daily  . Depression     takes Cymbalta daily  . Adrenal adenoma     benign  . Impaired hearing     wears hearing aides  . Headache     nightly  . Joint pain   . Psoriasis   . Joint swelling   . Diarrhea     "often" takes Immodium as needed  . History of kidney stones   . History of shingles   . Left bundle branch block (LBBB)     identified in 2011    Surgeries: Procedure(s): RIGHT TOTAL KNEE ARTHROPLASTY on 01/31/2016   Discharged Condition: Improved  Hospital Course: Christina Green is an 82 y.o. female who was admitted 01/31/2016 for operative treatment ofPrimary osteoarthritis of right knee. Patient has severe unremitting pain that affects sleep, daily activities, and work/hobbies. After pre-op clearance the patient was taken to the operating room on 01/31/2016 and underwent  Procedure(s): RIGHT TOTAL KNEE ARTHROPLASTY.    Patient was given perioperative antibiotics: Anti-infectives    Start     Dose/Rate Route Frequency Ordered Stop   01/31/16 1600  clindamycin (CLEOCIN) IVPB 600 mg     600 mg 100 mL/hr over 30 Minutes Intravenous Every 6 hours 01/31/16 1406 02/01/16 0105   01/31/16 0830  clindamycin (CLEOCIN) IVPB 900 mg     900 mg 100 mL/hr over 30 Minutes Intravenous To [facility] 01/28/16 1009 01/31/16 0915       Patient was given sequential compression devices, early ambulation, and chemoprophylaxis to prevent DVT.she had a decreased hemoglobin.she remained asymptomatic.   She was started on oral iron.  She made good progress with physical therapy.  On the date of discharge she was afebrile, her vital signs were stable, and her right knee dressing was clean and dry.  Her calf was soft.  Patient benefited maximally from hospital stay and there were no complications.    Recent vital signs: Patient Vitals for the past 24 hrs:  BP Temp Temp src Pulse Resp SpO2  02/03/16 0342 (!) 141/52 mmHg 97.5 F (36.4 C) Oral 76 18 95 %  02/02/16 2148 (!) 130/47 mmHg - - 60 - -  02/02/16 2034 (!) 122/45 mmHg 98.1 F (36.7 C) Oral 64 18 92 %  02/02/16 1300 (!) 103/51 mmHg 97.5 F (36.4 C) Oral (!) 58 16 98 %     Recent laboratory studies:  Recent Labs  02/01/16 0534 02/02/16 0445 02/03/16 0457  WBC 11.0* 12.0* 8.2  HGB 10.2* 8.1* 8.0*  HCT 29.5* 23.7* 23.1*  PLT 252 230 224  NA 137  --   --   K 3.6  --   --   CL 98*  --   --   CO2 26  --   --   BUN 11  --   --   CREATININE 0.86  --   --   GLUCOSE 212*  --   --   CALCIUM 8.3*  --   --  Discharge Medications:     Medication List    STOP taking these medications        ibuprofen 200 MG tablet  Commonly known as:  ADVIL,MOTRIN      TAKE these medications        amLODipine 5 MG tablet  Commonly known as:  NORVASC  Take 5 mg by mouth daily.     aspirin EC 325 MG tablet  Take 1 tablet (325 mg total) by mouth 2 (two) times daily after a meal. Take x 1 month post op to decrease risk of blood clots.     atorvastatin 40 MG tablet  Commonly known as:  LIPITOR  Take 40 mg by mouth daily.     DULoxetine 60 MG capsule  Commonly known as:  CYMBALTA  Take 60 mg by mouth daily.     ferrous sulfate 325 (65 FE) MG tablet  Take 1 tablet (325 mg total) by mouth 2 (two) times daily with a meal.     FISH OIL PO  Take 1 capsule by mouth daily.     HYDROcodone-acetaminophen 5-325 MG tablet  Commonly known as:  NORCO  Take 1-2 tablets by mouth every 6 (six) hours as needed for severe pain.     labetalol  100 MG tablet  Commonly known as:  NORMODYNE  Take 2 tablets by mouth 2 (two) times daily.     losartan-hydrochlorothiazide 100-12.5 MG tablet  Commonly known as:  HYZAAR  Take 1 tablet by mouth daily.        Diagnostic Studies: Dg Chest 2 View  01/19/2016  CLINICAL DATA:  Hypertension. EXAM: CHEST  2 VIEW COMPARISON:  09/23/2010. FINDINGS: Mediastinum and hilar structures are normal. Cardiomegaly. Very mild pulmonary interstitial prominence noted. Very mild component congestive heart failure cannot be entirely excluded. Mild interstitial pneumonitis cannot be excluded. No pleural effusion or pneumothorax. No acute bony abnormality . Surgical clips upper abdomen. IMPRESSION: 1. Cardiomegaly. 2. Mild bilateral pulmonary interstitial prominence. Very mild interstitial edema or interstitial pneumonitis cannot be completely excluded . Electronically Signed   By: Marcello Moores  Register   On: 01/19/2016 11:02    Disposition: skilled nursing facility      Discharge Instructions    CPM    Complete by:  As directed   Continuous passive motion machine (CPM):      Use the CPM from 0 to 60 for 8 hours per day.      You may increase by 5-10 per day.  You may break it up into 2 or 3 sessions per day.      Use CPM for 1-2 weeks or until you are told to stop.     Call MD / Call 911    Complete by:  As directed   If you experience chest pain or shortness of breath, CALL 911 and be transported to the hospital emergency room.  If you develope a fever above 101 F, pus (white drainage) or increased drainage or redness at the wound, or calf pain, call your surgeon's office.     Constipation Prevention    Complete by:  As directed   Drink plenty of fluids.  Prune juice may be helpful.  You may use a stool softener, such as Colace (over the counter) 100 mg twice a day.  Use MiraLax (over the counter) for constipation as needed.     Diet general    Complete by:  As directed      Do not put a pillow  under the  knee. Place it under the heel.    Complete by:  As directed      Increase activity slowly as tolerated    Complete by:  As directed      Weight bearing as tolerated    Complete by:  As directed   Laterality:  right  Extremity:  Lower     Weight bearing as tolerated    Complete by:  As directed   Laterality:  right  Extremity:  Lower           Follow-up Information    Follow up with GRAVES,JOHN L, MD. Schedule an appointment as soon as possible for a visit in 2 weeks.   Specialty:  Orthopedic Surgery   Contact information:   [place] [address] [phone]        Signed: Erlene Senters 02/03/2016, 8:59 AM

## 2016-02-03 NOTE — Progress Notes (Signed)
Physical Therapy Treatment Patient Details Name: Christina Green MRN: [id number] DOB: [birth year] Today's Date: 02/03/2016    History of Present Illness Pt admitted 3/6 for elective R TKA. Pt had L TKA 09/2016.    PT Comments    Patient is doing well with mobility. Needs cues for safe transfers and with difficulty WB on R LE this session. Pt with no c/o SOB this session but reported frequently feeling dizzy and like the room is spinning. No LOB. Current plan remains appropriate.   Follow Up Recommendations  SNF;Supervision/Assistance - 24 hour     Equipment Recommendations  None recommended by PT    Recommendations for Other Services       Precautions / Restrictions Precautions Precautions: Fall;Knee Precaution Booklet Issued: Yes (comment) Required Braces or Orthoses: Knee Immobilizer - Right Knee Immobilizer - Right: On when out of bed or walking Restrictions Weight Bearing Restrictions: Yes RLE Weight Bearing: Weight bearing as tolerated    Mobility  Bed Mobility Overal bed mobility: Needs Assistance Bed Mobility: Supine to Sit     Supine to sit: Min guard     General bed mobility comments: cues for technique and increased time needed; min guard to lower R LE from EOB  Transfers Overall transfer level: Needs assistance Equipment used: Rolling walker (2 wheeled) Transfers: Sit to/from Stand Sit to Stand: Min guard         General transfer comment: from EOB and BSC; cues for safe hand placement both trials; pt has tendency to hold onto RW to descend; cues for reaching back and increased safety awareness  Ambulation/Gait Ambulation/Gait assistance: Min guard Ambulation Distance (Feet): 80 Feet Assistive device: Rolling walker (2 wheeled) Gait Pattern/deviations: Step-through pattern;Decreased stride length;Decreased stance time - right;Decreased step length - left;Decreased dorsiflexion - left;Shuffle (shuffling with L foot) Gait velocity: decreased    General Gait Details: pt with difficulty weightbearing on R LE with tendency to shuffle with L foot; cues for bilat heel strike and step length; safe use of AD this session with better position of RW   Stairs            Wheelchair Mobility    Modified Rankin (Stroke Patients Only)       Balance   Sitting-balance support: No upper extremity supported;Feet supported Sitting balance-Leahy Scale: Good     Standing balance support: Bilateral upper extremity supported Standing balance-Leahy Scale: Fair                      Cognition Arousal/Alertness: Awake/alert Behavior During Therapy: WFL for tasks assessed/performed Overall Cognitive Status: Within Functional Limits for tasks assessed                      Exercises Total Joint Exercises Quad Sets: AROM;10 reps;Both;Seated Heel Slides: AROM;Right;15 reps;Seated Hip ABduction/ADduction: AROM;Right;15 reps;Seated Goniometric ROM: 0-85    General Comments General comments (skin integrity, edema, etc.): pt continues to c/o dizziness during session and reported that it feels like the room is spinning sometimes; no LOB or nausea      Pertinent Vitals/Pain Pain Assessment: Faces Faces Pain Scale: Hurts little more Pain Location: R knee  Pain Descriptors / Indicators: Sore;Tightness Pain Intervention(s): Limited activity within patient's tolerance;Monitored during session;Repositioned    Home Living                      Prior Function            PT  Goals (current goals can now be found in the care plan section) Acute Rehab PT Goals Patient Stated Goal: rehab and home in 2 weeks PT Goal Formulation: With patient Time For Goal Achievement: 02/07/16 Potential to Achieve Goals: Good Progress towards PT goals: Progressing toward goals    Frequency  7X/week    PT Plan Current plan remains appropriate    Co-evaluation             End of Session Equipment Utilized During  Treatment: Gait belt Activity Tolerance: Patient tolerated treatment well Patient left: in chair;with call bell/phone within reach     Time: 0917-0944 PT Time Calculation (min) (ACUTE ONLY): 27 min  Charges:  $Gait Training: 8-22 mins $Therapeutic Exercise: 8-22 mins                    G Codes:      Salina April, PTA Pager: [phone]   02/03/2016, 9:56 AM

## 2016-02-08 ENCOUNTER — Encounter: Payer: Self-pay | Admitting: Internal Medicine

## 2016-02-08 ENCOUNTER — Non-Acute Institutional Stay (SKILLED_NURSING_FACILITY): Payer: Medicare Other | Admitting: Internal Medicine

## 2016-02-08 DIAGNOSIS — D62 Acute posthemorrhagic anemia: Secondary | ICD-10-CM

## 2016-02-08 DIAGNOSIS — F329 Major depressive disorder, single episode, unspecified: Secondary | ICD-10-CM | POA: Diagnosis not present

## 2016-02-08 DIAGNOSIS — I10 Essential (primary) hypertension: Secondary | ICD-10-CM

## 2016-02-08 DIAGNOSIS — K5901 Slow transit constipation: Secondary | ICD-10-CM

## 2016-02-08 DIAGNOSIS — R2681 Unsteadiness on feet: Secondary | ICD-10-CM

## 2016-02-08 DIAGNOSIS — E785 Hyperlipidemia, unspecified: Secondary | ICD-10-CM

## 2016-02-08 DIAGNOSIS — M1711 Unilateral primary osteoarthritis, right knee: Secondary | ICD-10-CM

## 2016-02-08 NOTE — Progress Notes (Signed)
LOCATION: [facility]  PCP: Irven Shelling, MD   Code Status: Full Code  Goals of care: Advanced Directive information Advanced Directives 01/31/2016  Does patient have an advance directive? -  Type of Paramedic of [place];Living will  Copy of advanced directive(s) in chart? Yes       Extended Emergency Contact Information Primary Emergency Contact: Makinson,Linda Address: [address]          [place] [address] Johnnette Litter of [place] Phone: [phone] Mobile Phone: [phone] Relation: Daughter Secondary Emergency Contact: Cleatrice Burke [place] of [place] Phone: [phone] Mobile Phone: [phone] Relation: Son   Allergies  Allergen Reactions  . Penicillins Other (See Comments)    unknown    Chief Complaint  Patient presents with  . New Admit To SNF    New Admission     HPI:  Patient is a 82 y.o. female seen today for short term rehabilitation post hospital admission from 01/31/16-02/03/16 with right knee OA. She underwent right total knee arthroplasty. She is seen in her room today. Her pain is under control with current pain regimen.   Review of Systems:  Constitutional: Negative for fever, chills, malaise and diaphoresis. Energy level is improving.  HENT: Negative for headache, congestion, nasal discharge, hearing loss, sore throat, difficulty swallowing. Positive for dry mouth.   Eyes: Negative for blurred vision, double vision and discharge.  Respiratory: Negative for cough, shortness of breath and wheezing.   Cardiovascular: Negative for chest pain, palpitations. Positive for some leg swelling but ted hose are helping.  Gastrointestinal: Negative for heartburn, nausea, vomiting, abdominal pain, loss of appetite, melena, diarrhea and constipation. Last bowel movement was yesterday.  Genitourinary: Negative for dysuria and flank pain.  Musculoskeletal: Negative for back pain, fall in the  facility.  Skin: Negative for itching, rash.  Neurological: Negative for dizziness. Psychiatric/Behavioral: Negative for depression   Past Medical History  Diagnosis Date  . Hyperlipemia     takes Atorvastatin daily  . DJD (degenerative joint disease) of knee     and hands  . Graves disease   . Hypertension     takes Hyzaar and Labetalol daily  . Depression     takes Cymbalta daily  . Adrenal adenoma     benign  . Impaired hearing     wears hearing aides  . Headache     nightly  . Joint pain   . Psoriasis   . Joint swelling   . Diarrhea     "often" takes Immodium as needed  . History of kidney stones   . History of shingles   . Left bundle branch block (LBBB)     identified in 2011   Past Surgical History  Procedure Laterality Date  . Cholecystectomy    . Cataract extraction    . Replacement total knee Left   . Cardiac catheterization    . Knee arthroscopy w/ meniscal repair Right   . Colonscopy    . Total knee arthroplasty Right 01/31/2016    Procedure: RIGHT TOTAL KNEE ARTHROPLASTY;  Surgeon: Dorna Leitz, MD;  Location: [facility];  Service: Orthopedics;  Laterality: Right;   Social History:   reports that she has never smoked. She does not have any smokeless tobacco history on file. She reports that she drinks alcohol. She reports that she does not use illicit drugs.  Family History  Problem Relation Age of Onset  . Hypertension Mother   . Heart disease Mother   .  Heart disease Father   . Hypertension Father   . Hypertension Sister   . CVA Sister     Medications:   Medication List       This list is accurate as of: 02/08/16  3:17 PM.  Always use your most recent med list.               amLODipine 5 MG tablet  Commonly known as:  NORVASC  Take 5 mg by mouth daily.     aspirin EC 325 MG tablet  Take 1 tablet (325 mg total) by mouth 2 (two) times daily after a meal. Take x 1 month post op to decrease risk of blood clots.     atorvastatin 40 MG tablet   Commonly known as:  LIPITOR  Take 40 mg by mouth daily.     docusate sodium 100 MG capsule  Commonly known as:  COLACE  Take 100 mg by mouth 2 (two) times daily.     DULoxetine 60 MG capsule  Commonly known as:  CYMBALTA  Take 60 mg by mouth daily.     ferrous sulfate 325 (65 FE) MG tablet  Take 1 tablet (325 mg total) by mouth 2 (two) times daily with a meal.     FISH OIL PO  Take 1 capsule by mouth daily.     HYDROcodone-acetaminophen 5-325 MG tablet  Commonly known as:  NORCO  Take 1-2 tablets by mouth every 6 (six) hours as needed for severe pain.     labetalol 100 MG tablet  Commonly known as:  NORMODYNE  Take 2 tablets by mouth 2 (two) times daily.     losartan-hydrochlorothiazide 100-12.5 MG tablet  Commonly known as:  HYZAAR  Take 1 tablet by mouth daily.     polyethylene glycol packet  Commonly known as:  MIRALAX / GLYCOLAX  Take 17 g by mouth daily as needed.        Immunizations: Immunization History  Administered Date(s) Administered  . PPD Test 02/04/2016  . Tdap 04/22/2014     Physical Exam: Filed Vitals:   02/08/16 1506  BP: 169/71  Pulse: 64  Temp: 97.7 F (36.5 C)  TempSrc: Oral  Resp: 18  Height: 4\' 11"  (1.499 m)  Weight: 137 lb (62.143 kg)  SpO2: 94%   Body mass index is 27.66 kg/(m^2).  General- elderly female, well built, in no acute distress Head- normocephalic, atraumatic Nose- no maxillary or frontal sinus tenderness, no nasal discharge Throat- moist mucus membrane Eyes- PERRLA, EOMI, no pallor, no icterus, no discharge, normal conjunctiva, normal sclera Neck- no cervical lymphadenopathy Cardiovascular- normal s1,s2, no murmur, ted hose +, good dorsalis pedis, trace edema + Respiratory- bilateral clear to auscultation, no wheeze, no rhonchi, no crackles, no use of accessory muscles Abdomen- bowel sounds present, soft, non tender Musculoskeletal- able to move all 4 extremities, limited right kneE range of  motion Neurological- alert and oriented to person, place and time Skin- warm and dry, surgical incision to right knee with aquacel dressing with some drainage stain noted, bruise noted on right thigh and leg Psychiatry- normal mood and affect     Labs reviewed: Basic Metabolic Panel:  Recent Labs  01/19/16 1028 02/01/16 02/01/16 0534  NA 137 137 137  K 3.8  --  3.6  CL 101  --  98*  CO2 25  --  26  GLUCOSE 140*  --  212*  BUN 11 11 11   CREATININE 0.69 0.9 0.86  CALCIUM 9.3  --  8.3*   Liver Function Tests:  Recent Labs  01/19/16 1028  AST 19  ALT 13*  ALKPHOS 77  BILITOT 0.6  PROT 6.6  ALBUMIN 3.9   No results for input(s): LIPASE, AMYLASE in the last 8760 hours. No results for input(s): AMMONIA in the last 8760 hours. CBC:  Recent Labs  01/19/16 1028 02/01/16 0534 02/02/16 0445 02/03/16 02/03/16 0457  WBC 6.7 11.0* 12.0* 8.2 8.2  NEUTROABS 4.7  --   --   --   --   HGB 13.1 10.2* 8.1*  --  8.0*  HCT 37.9 29.5* 23.7*  --  23.1*  MCV 92.9 92.2 93.3  --  92.8  PLT 281 252 230  --  224    Radiological Exams: Dg Chest 2 View  01/19/2016  CLINICAL DATA:  Hypertension. EXAM: CHEST  2 VIEW COMPARISON:  09/23/2010. FINDINGS: Mediastinum and hilar structures are normal. Cardiomegaly. Very mild pulmonary interstitial prominence noted. Very mild component congestive heart failure cannot be entirely excluded. Mild interstitial pneumonitis cannot be excluded. No pleural effusion or pneumothorax. No acute bony abnormality . Surgical clips upper abdomen. IMPRESSION: 1. Cardiomegaly. 2. Mild bilateral pulmonary interstitial prominence. Very mild interstitial edema or interstitial pneumonitis cannot be completely excluded . Electronically Signed   By: Marcello Moores  Register   On: 01/19/2016 11:02    Assessment/Plan  Unsteady gait With right knee surgery. Will have patient work with PT/OT as tolerated to regain strength and restore function.  Fall precautions are in  place.  Right knee OA S/p right total knee arthroplasty. Continue norco 5-325 mg 1-2 tab q6h prn pain and aspirin 325 mg bid for dvt prophylaxis. Has follow up with orthopedics. Will have her work with physical therapy and occupational therapy team to help with gait training and muscle strengthening exercises.fall precautions. Skin care. Encourage to be out of bed.   Blood loss anemia Post op, monitor cbc. Continue feso4 325 mg tid  HTN Elevated bp, check bp bid x 1 week. Continue amlodipine 5 mg daily, labetalol 200 mg bid and losartan-hctz 100-12.5 mg daily. Check bmp  HLD Continue lipitor 40 mg daily  Depression Continue cymbalta 60 mg daily  Constipation Continue miralax daily as needed with colace 100 mg bid  Goals of care: short term rehabilitation   Labs/tests ordered: cbc, bmp 02/09/16  Family/ staff Communication: reviewed care plan with patient and nursing supervisor    Blanchie Serve, MD Internal Medicine [place], [place] [address] Cell Phone (Monday-Friday 8 am - 5 pm): [phone] On Call: [phone] and follow prompts after 5 pm and on weekends Office Phone: [phone] Office Fax: [phone]

## 2016-02-09 LAB — CBC AND DIFFERENTIAL
HEMATOCRIT: 27 % — AB (ref 36–46)
Hemoglobin: 8.6 g/dL — AB (ref 12.0–16.0)
Platelets: 377 10*3/uL (ref 150–399)
WBC: 8.6 10*3/mL

## 2016-02-09 LAB — BASIC METABOLIC PANEL
BUN: 12 mg/dL (ref 4–21)
CREATININE: 0.7 mg/dL (ref <=1.1)
Glucose: 128 mg/dL
POTASSIUM: 3.7 mmol/L (ref 3.4–5.3)
Sodium: 138 mmol/L (ref 137–147)

## 2016-02-10 ENCOUNTER — Encounter: Payer: Self-pay | Admitting: Family

## 2016-02-10 ENCOUNTER — Non-Acute Institutional Stay (SKILLED_NURSING_FACILITY): Payer: Medicare Other | Admitting: Family

## 2016-02-10 DIAGNOSIS — D62 Acute posthemorrhagic anemia: Secondary | ICD-10-CM

## 2016-02-10 DIAGNOSIS — Z96651 Presence of right artificial knee joint: Secondary | ICD-10-CM

## 2016-02-10 MED ORDER — FERROUS SULFATE 325 (65 FE) MG PO TABS: 325.0000 mg | ORAL_TABLET | Freq: Three times a day (TID) | ORAL | Status: DC

## 2016-02-10 NOTE — Progress Notes (Signed)
Location:  [place] Room Number: 102 Place of Service:  SNF (31) Provider:  Kelby Fam, Dinah NP  Irven Shelling, MD  Patient Care Team: Lavone Orn, MD as PCP - General (Internal Medicine)  Extended Emergency Contact Information Primary Emergency Contact: Makinson,Linda Address: [address]          [place] [address] Johnnette Litter of [place] Phone: [phone] Mobile Phone: [phone] Relation: Daughter Secondary Emergency Contact: Cleatrice Burke [place] of [place] Phone: [phone] Mobile Phone: [phone] Relation: Son  Code Status: FullCode Goals of care: Advanced Directive information Advanced Directives 02/10/2016  Does patient have an advance directive? Yes  Type of Advance Directive [place]  Does patient want to make changes to advanced directive? No - Patient declined  Copy of advanced directive(s) in chart? Yes     Chief Complaint  Patient presents with  . Acute Visit    HPI:  Pt is a 82 y.o. female seen today at [facility] for an acute visit for abnormal labs.she is post hospital admission 01/31/2016-02/03/2016 for right knee OA . She underwent right total knee arthroplasty. She has a medical history of HTN, Depression, DJD among others. She is seen in her room today reading her news paper. She denies any acute issues. States has been doing working with PT/OT. Her right knee pain is well controlled with current pain regimen.Her recent lab Hgb 8.6 (02/09/2016). She denies any dizziness, headache,shortness of breath or chest pain. Facility staff reports no new concerns.      Past Medical History  Diagnosis Date  . Hyperlipemia     takes Atorvastatin daily  . DJD (degenerative joint disease) of knee     and hands  . Graves disease   . Hypertension     takes Hyzaar and Labetalol daily  . Depression     takes Cymbalta daily  . Adrenal adenoma     benign    . Impaired hearing     wears hearing aides  . Headache     nightly  . Joint pain   . Psoriasis   . Joint swelling   . Diarrhea     "often" takes Immodium as needed  . History of kidney stones   . History of shingles   . Left bundle branch block (LBBB)     identified in 2011   Past Surgical History  Procedure Laterality Date  . Cholecystectomy    . Cataract extraction    . Replacement total knee Left   . Cardiac catheterization    . Knee arthroscopy w/ meniscal repair Right   . Colonscopy    . Total knee arthroplasty Right 01/31/2016    Procedure: RIGHT TOTAL KNEE ARTHROPLASTY;  Surgeon: Dorna Leitz, MD;  Location: [place];  Service: Orthopedics;  Laterality: Right;    Allergies  Allergen Reactions  . Penicillins Other (See Comments)    unknown      Medication List       This list is accurate as of: 02/10/16 11:15 AM.  Always use your most recent med list.               amLODipine 5 MG tablet  Commonly known as:  NORVASC  Take 5 mg by mouth daily. For HTN     aspirin EC 325 MG tablet  Take 1 tablet (325 mg total) by mouth 2 (two) times daily after a meal. Take x 1 month post op  to decrease risk of blood clots.     atorvastatin 40 MG tablet  Commonly known as:  LIPITOR  Take 40 mg by mouth daily. For hyperlipidemia     docusate sodium 100 MG capsule  Commonly known as:  COLACE  Take 100 mg by mouth 2 (two) times daily. For constipation     DULoxetine 60 MG capsule  Commonly known as:  CYMBALTA  Take 60 mg by mouth daily. For depression     ferrous sulfate 325 (65 FE) MG tablet  Take 1 tablet (325 mg total) by mouth 2 (two) times daily with a meal.     FISH OIL PO  Take 1 capsule by mouth daily.     HYDROcodone-acetaminophen 5-325 MG tablet  Commonly known as:  NORCO  Take 1-2 tablets by mouth every 6 (six) hours as needed for severe pain.     labetalol 100 MG tablet  Commonly known as:  NORMODYNE  Take 2 tablets by mouth 2 (two) times daily. For HTN      losartan-hydrochlorothiazide 100-12.5 MG tablet  Commonly known as:  HYZAAR  Take 1 tablet by mouth daily. For HTN     polyethylene glycol packet  Commonly known as:  MIRALAX / GLYCOLAX  Take 17 g by mouth daily as needed for mild constipation or moderate constipation.        Review of Systems  Constitutional: Negative for fever, chills, activity change, appetite change and fatigue.  HENT: Negative.   Eyes: Negative.   Respiratory: Negative for chest tightness, shortness of breath and wheezing.   Cardiovascular: Negative for chest pain, palpitations and leg swelling.  Gastrointestinal: Negative.   Genitourinary: Negative.   Musculoskeletal: Positive for gait problem.  Skin: Negative.   Neurological: Negative for dizziness, syncope, light-headedness and headaches.  Hematological: Negative.   Psychiatric/Behavioral: Negative.     Immunization History  Administered Date(s) Administered  . PPD Test 02/04/2016  . Tdap 04/22/2014   Pertinent  Health Maintenance Due  Topic Date Due  . PNA vac Low Risk Adult (1 of 2 - PCV13) 11/30/1993  . INFLUENZA VACCINE  06/28/2015  . DEXA SCAN  Completed   No flowsheet data found. Functional Status Survey:    Filed Vitals:   02/10/16 1057  BP: 121/53  Pulse: 62  Temp: 97.3 F (36.3 C)  TempSrc: Oral  Resp: 18  Height: 4\' 11"  (1.499 m)  Weight: 137 lb (62.143 kg)  SpO2: 94%   Body mass index is 27.66 kg/(m^2). Physical Exam  Constitutional: She is oriented to person, place, and time. She appears well-developed and well-nourished. No distress.  HENT:  Head: Normocephalic.  Mouth/Throat: Oropharynx is clear and moist. No oropharyngeal exudate.  Eyes: Conjunctivae and EOM are normal. Pupils are equal, round, and reactive to light. Right eye exhibits no discharge. Left eye exhibits no discharge. No scleral icterus.  Neck: Normal range of motion. No JVD present. No thyromegaly present.  Cardiovascular: Normal rate, regular  rhythm, normal heart sounds and intact distal pulses.  Exam reveals no gallop and no friction rub.   No murmur heard. Pulmonary/Chest: Effort normal and breath sounds normal. No respiratory distress. She has no wheezes. She has no rales. She exhibits no tenderness.  Abdominal: Soft. Bowel sounds are normal. She exhibits no distension and no mass. There is no tenderness. There is no rebound and no guarding.  Musculoskeletal: She exhibits no edema or tenderness.  Normal ROM except right knee limited by pain. Bilat. Thigh high ted hose  in place.   Lymphadenopathy:    She has no cervical adenopathy.  Neurological: She is oriented to person, place, and time.  Skin: Skin is warm and dry.  Right knee surgical incision drsg clean, dry, and intact. Surrounding skin tissue without any redness, swelling or tender to touch.   Psychiatric: She has a normal mood and affect.    Labs reviewed:  Recent Labs  01/19/16 1028 02/01/16 02/01/16 0534 02/09/16  NA 137 137 137 138  K 3.8  --  3.6 3.7  CL 101  --  98*  --   CO2 25  --  26  --   GLUCOSE 140*  --  212*  --   BUN 11 11 11 12   CREATININE 0.69 0.9 0.86 0.7  CALCIUM 9.3  --  8.3*  --     Recent Labs  01/19/16 1028  AST 19  ALT 13*  ALKPHOS 77  BILITOT 0.6  PROT 6.6  ALBUMIN 3.9    Recent Labs  01/19/16 1028 02/01/16 0534 02/02/16 0445 02/03/16 02/03/16 0457 02/09/16  WBC 6.7 11.0* 12.0* 8.2 8.2 8.6  NEUTROABS 4.7  --   --   --   --   --   HGB 13.1 10.2* 8.1*  --  8.0* 8.6*  HCT 37.9 29.5* 23.7*  --  23.1* 27*  MCV 92.9 92.2 93.3  --  92.8  --   PLT 281 252 230  --  224 377   No results found for: TSH No results found for: HGBA1C No results found for: CHOL, HDL, LDLCALC, LDLDIRECT, TRIG, CHOLHDL  Significant Diagnostic Results in last 30 days:  Dg Chest 2 View  01/19/2016  CLINICAL DATA:  Hypertension. EXAM: CHEST  2 VIEW COMPARISON:  09/23/2010. FINDINGS: Mediastinum and hilar structures are normal. Cardiomegaly. Very  mild pulmonary interstitial prominence noted. Very mild component congestive heart failure cannot be entirely excluded. Mild interstitial pneumonitis cannot be excluded. No pleural effusion or pneumothorax. No acute bony abnormality . Surgical clips upper abdomen. IMPRESSION: 1. Cardiomegaly. 2. Mild bilateral pulmonary interstitial prominence. Very mild interstitial edema or interstitial pneumonitis cannot be completely excluded . Electronically Signed   By: Marcello Moores  Register   On: 01/19/2016 11:02    Assessment/Plan Anemia  Recent Hgb 8.6.( 02/09/2016) Negative exam findings. Will increase Ferrous sulfate 325 mg Tablet to three times daily.   S/p Right knee Arthroplasty Afebrile. Surgical drsg dry, clean and intact without any signs of infections. Continue to monitor and follow up ortho.   Family/ staff Communication: reviewed plan with patient and facility staff Nurse.   Labs/tests ordered:  CBC 02/17/2016

## 2016-02-14 ENCOUNTER — Non-Acute Institutional Stay (SKILLED_NURSING_FACILITY): Payer: Medicare Other | Admitting: Family

## 2016-02-14 ENCOUNTER — Encounter: Payer: Self-pay | Admitting: Family

## 2016-02-14 DIAGNOSIS — E785 Hyperlipidemia, unspecified: Secondary | ICD-10-CM | POA: Diagnosis not present

## 2016-02-14 DIAGNOSIS — F329 Major depressive disorder, single episode, unspecified: Secondary | ICD-10-CM | POA: Diagnosis not present

## 2016-02-14 DIAGNOSIS — R269 Unspecified abnormalities of gait and mobility: Secondary | ICD-10-CM | POA: Diagnosis not present

## 2016-02-14 DIAGNOSIS — M1711 Unilateral primary osteoarthritis, right knee: Secondary | ICD-10-CM

## 2016-02-14 DIAGNOSIS — I10 Essential (primary) hypertension: Secondary | ICD-10-CM | POA: Insufficient documentation

## 2016-02-14 DIAGNOSIS — K5901 Slow transit constipation: Secondary | ICD-10-CM | POA: Diagnosis not present

## 2016-02-14 DIAGNOSIS — D649 Anemia, unspecified: Secondary | ICD-10-CM | POA: Diagnosis not present

## 2016-02-14 DIAGNOSIS — F32A Depression, unspecified: Secondary | ICD-10-CM

## 2016-02-14 NOTE — Progress Notes (Signed)
Location:  [place] Room Number: 102 Place of Service:  SNF [phone])  Provider: Blanchie Serve, MD   PCP: Irven Shelling, MD Patient Care Team: Lavone Orn, MD as PCP - General (Internal Medicine)  Extended Emergency Contact Information Primary Emergency Contact: Makinson,Linda Address: [address]          [place] [address] Johnnette Litter of [place] Phone: [phone] Mobile Phone: [phone] Relation: Daughter Secondary Emergency Contact: Cleatrice Burke States of [place] Phone: [phone] Mobile Phone: [phone] Relation: Son  Code Status:Full Code  Goals of care:  Advanced Directive information Advanced Directives 02/10/2016  Does patient have an advance directive? Yes  Type of Advance Directive [place]  Does patient want to make changes to advanced directive? No - Patient declined  Copy of advanced directive(s) in chart? Yes     Allergies  Allergen Reactions  . Penicillins Other (See Comments)    unknown    Chief Complaint  Patient presents with  . Discharge Note    HPI:  82 y.o. female seen today at [facility] for discharge home. She is here for short term rehabilitation post hospital admission from 01/31/16-02/03/16 with right knee OA. She underwent right total knee arthroplasty. She is seen in her room today sitting on recliner. Her pain is under control with current pain regimen. She denies any acute issues today. She has worked well with PT/OT now stable for discharge home. Will Discharge home with PT/OT to continue with ROM, exercise, gait stability and muscle strengthening.HH PT/OT to be arranged by facility social worker prior to discharge. No DME required.       Past Medical History  Diagnosis Date  . Hyperlipemia     takes Atorvastatin daily  . DJD (degenerative joint disease) of knee     and hands  . Graves disease   . Hypertension     takes  Hyzaar and Labetalol daily  . Depression     takes Cymbalta daily  . Adrenal adenoma     benign  . Impaired hearing     wears hearing aides  . Headache     nightly  . Joint pain   . Psoriasis   . Joint swelling   . Diarrhea     "often" takes Immodium as needed  . History of kidney stones   . History of shingles   . Left bundle branch block (LBBB)     identified in 2011    Past Surgical History  Procedure Laterality Date  . Cholecystectomy    . Cataract extraction    . Replacement total knee Left   . Cardiac catheterization    . Knee arthroscopy w/ meniscal repair Right   . Colonscopy    . Total knee arthroplasty Right 01/31/2016    Procedure: RIGHT TOTAL KNEE ARTHROPLASTY;  Surgeon: Dorna Leitz, MD;  Location: [facility];  Service: Orthopedics;  Laterality: Right;      reports that she has never smoked. She does not have any smokeless tobacco history on file. She reports that she drinks alcohol. She reports that she does not use illicit drugs. Social History   Social History  . Marital Status: Widowed    Spouse Name: N/A  . Number of Children: N/A  . Years of Education: N/A   Occupational History  . Not on file.   Social History Main Topics  . Smoking status: Never Smoker   . Smokeless tobacco: Not  on file  . Alcohol Use: Yes     Comment: wine occasionally  . Drug Use: No  . Sexual Activity: Not on file   Other Topics Concern  . Not on file   Social History Narrative   Functional Status Survey:    Allergies  Allergen Reactions  . Penicillins Other (See Comments)    unknown    Pertinent  Health Maintenance Due  Topic Date Due  . PNA vac Low Risk Adult (1 of 2 - PCV13) 11/30/1993  . INFLUENZA VACCINE  06/28/2015  . DEXA SCAN  Completed    Medications:   Medication List       This list is accurate as of: 02/14/16 12:46 PM.  Always use your most recent med list.               amLODipine 5 MG tablet  Commonly known as:  NORVASC  Take 5 mg by  mouth daily. For HTN     aspirin EC 325 MG tablet  Take 1 tablet (325 mg total) by mouth 2 (two) times daily after a meal. Take x 1 month post op to decrease risk of blood clots.     atorvastatin 40 MG tablet  Commonly known as:  LIPITOR  Take 40 mg by mouth daily. For hyperlipidemia     docusate sodium 100 MG capsule  Commonly known as:  COLACE  Take 100 mg by mouth 2 (two) times daily. For constipation     DULoxetine 60 MG capsule  Commonly known as:  CYMBALTA  Take 60 mg by mouth daily. For depression     ferrous sulfate 325 (65 FE) MG tablet  Take 1 tablet (325 mg total) by mouth 3 (three) times daily with meals.     FISH OIL PO  Take 1 capsule by mouth daily.     HYDROcodone-acetaminophen 5-325 MG tablet  Commonly known as:  NORCO  Take 1-2 tablets by mouth every 6 (six) hours as needed for severe pain.     labetalol 100 MG tablet  Commonly known as:  NORMODYNE  Take 2 tablets by mouth 2 (two) times daily. For HTN     losartan-hydrochlorothiazide 100-12.5 MG tablet  Commonly known as:  HYZAAR  Take 1 tablet by mouth daily. For HTN     polyethylene glycol packet  Commonly known as:  MIRALAX / GLYCOLAX  Take 17 g by mouth daily as needed for mild constipation or moderate constipation.        Review of Systems  Constitutional: Negative for fever, activity change, appetite change and fatigue.  HENT: Negative for congestion, rhinorrhea, sinus pressure, sneezing and sore throat.   Eyes: Negative.   Respiratory: Negative.   Cardiovascular: Negative.   Gastrointestinal: Negative.   Endocrine: Negative.   Genitourinary: Negative.   Musculoskeletal: Positive for gait problem.  Skin: Negative.   Allergic/Immunologic: Negative.   Neurological: Negative.   Hematological: Negative.   Psychiatric/Behavioral: Negative.     Filed Vitals:   02/14/16 1237  BP: 122/58  Pulse: 58  Temp: 98 F (36.7 C)  TempSrc: Oral  Resp: 16  Height: 4\' 11"  (1.499 m)  Weight: 137  lb (62.143 kg)  SpO2: 93%   Body mass index is 27.66 kg/(m^2). Physical Exam  Constitutional: She is oriented to person, place, and time. She appears well-developed and well-nourished.  Pleasant Elderly in no acute distress.   HENT:  Head: Normocephalic.  Mouth/Throat: Oropharynx is clear and moist.  Eyes: Conjunctivae and EOM  are normal. Pupils are equal, round, and reactive to light. Right eye exhibits no discharge. Left eye exhibits no discharge. No scleral icterus.  Neck: Normal range of motion. No JVD present. No thyromegaly present.  Cardiovascular: Normal rate, regular rhythm, normal heart sounds and intact distal pulses.  Exam reveals no gallop and no friction rub.   No murmur heard. Pulmonary/Chest: Effort normal and breath sounds normal. No respiratory distress. She has no wheezes. She has no rales.  Abdominal: Soft. Bowel sounds are normal. She exhibits no distension and no mass. There is no tenderness. There is no rebound and no guarding.  Musculoskeletal: She exhibits no edema or tenderness.  Normal ROM except slight limited on right knee due to pain. Bilateral LE's Ted hose in place.   Lymphadenopathy:    She has no cervical adenopathy.  Neurological: She is oriented to person, place, and time.  Skin: Skin is warm and dry. No rash noted. No erythema. No pallor.  Right knee steri-strips dry, clean and intact  Psychiatric: She has a normal mood and affect.    Labs reviewed: Basic Metabolic Panel:  Recent Labs  01/19/16 1028 02/01/16 02/01/16 0534 02/09/16  NA 137 137 137 138  K 3.8  --  3.6 3.7  CL 101  --  98*  --   CO2 25  --  26  --   GLUCOSE 140*  --  212*  --   BUN 11 11 11 12   CREATININE 0.69 0.9 0.86 0.7  CALCIUM 9.3  --  8.3*  --    Liver Function Tests:  Recent Labs  01/19/16 1028  AST 19  ALT 13*  ALKPHOS 77  BILITOT 0.6  PROT 6.6  ALBUMIN 3.9   No results for input(s): LIPASE, AMYLASE in the last 8760 hours. No results for input(s):  AMMONIA in the last 8760 hours. CBC:  Recent Labs  01/19/16 1028 02/01/16 0534 02/02/16 0445 02/03/16 02/03/16 0457 02/09/16  WBC 6.7 11.0* 12.0* 8.2 8.2 8.6  NEUTROABS 4.7  --   --   --   --   --   HGB 13.1 10.2* 8.1*  --  8.0* 8.6*  HCT 37.9 29.5* 23.7*  --  23.1* 27*  MCV 92.9 92.2 93.3  --  92.8  --   PLT 281 252 230  --  224 377   Cardiac Enzymes: No results for input(s): CKTOTAL, CKMB, CKMBINDEX, TROPONINI in the last 8760 hours. BNP: Invalid input(s): POCBNP CBG: No results for input(s): GLUCAP in the last 8760 hours.  Procedures and Imaging Studies During Stay: Dg Chest 2 View  01/19/2016  CLINICAL DATA:  Hypertension. EXAM: CHEST  2 VIEW COMPARISON:  09/23/2010. FINDINGS: Mediastinum and hilar structures are normal. Cardiomegaly. Very mild pulmonary interstitial prominence noted. Very mild component congestive heart failure cannot be entirely excluded. Mild interstitial pneumonitis cannot be excluded. No pleural effusion or pneumothorax. No acute bony abnormality . Surgical clips upper abdomen. IMPRESSION: 1. Cardiomegaly. 2. Mild bilateral pulmonary interstitial prominence. Very mild interstitial edema or interstitial pneumonitis cannot be completely excluded . Electronically Signed   By: Marcello Moores  Register   On: 01/19/2016 11:02    Assessment/Plan:   HTN  B/p stable continue on Amlodipine, Hyzaar and Labetalol. Follow up with PCP to monitor BMP Hyperlipidemia Continue on Lipitor. Monitor lipid panel  Anemia S/p right knee replacement. Continue on Ferrous sulfate. PCP to recheck CBC Primary Osteoarthritis of right knee S/p short term rehabilitation post hospital admission from 01/31/16-02/03/16 with right knee  OA. She underwent right total knee arthroplasty.Pain well controlled with current regimen. Wean off pain med as tolerated. Continue PT/OT for ROM, exercise and muscle stregthening.   Abnormal gait   Will Discharge home with PT/OT to continue with ROM, exercise, gait  stability and muscle strengthening. Constipation  Continue on colace while on ferrous sulfate.   Depression No mood changes. Continue on Cymbalta. PCP to monitor.   Patient is being discharged with the following home health services:     PT/OT to continue with ROM, exercise, gait stability and muscle strengthening.  Patient is being discharged with the following durable medical equipment:   No DME required has own walker and Bedside commode at home.   Rx written X 1 month supply.   Patient has been advised to f/u with their PCP in 1-2 weeks to bring them up to date on their rehab stay.  Social services at facility was responsible for arranging this appointment.  Pt was provided with a 30 day supply of prescriptions for medications and refills must be obtained from their PCP.  For controlled substances, a more limited supply may be provided adequate until PCP appointment only.  Future labs/tests needed:  CBC, BMP

## 2017-09-16 IMAGING — CR DG CHEST 2V
2 series · 2 of 2 positions shown · non-contrast
Comparison: 09/23/2010.

CLINICAL DATA: Hypertension.

EXAM:
CHEST  2 VIEW

[w chest pa]
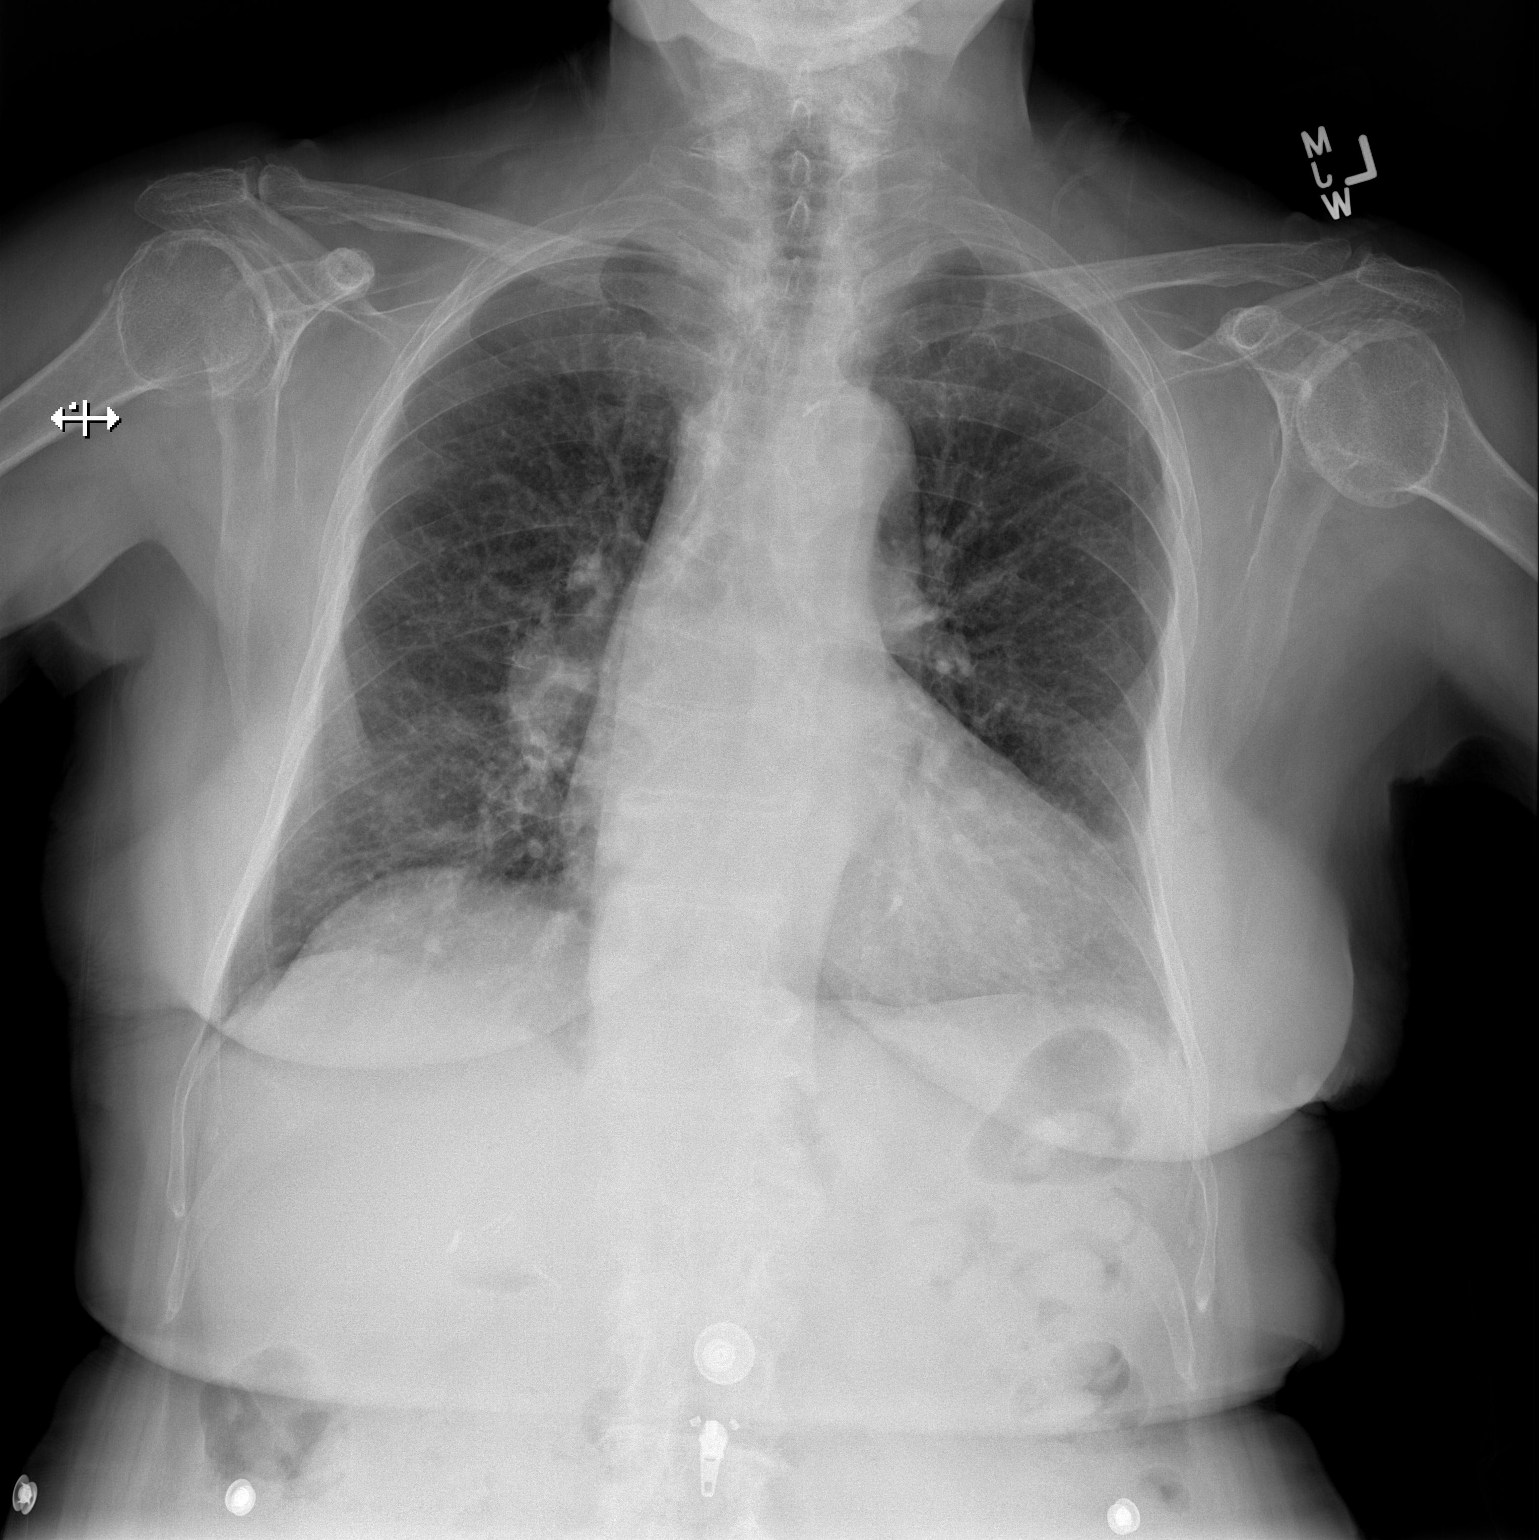

[w chest lat]
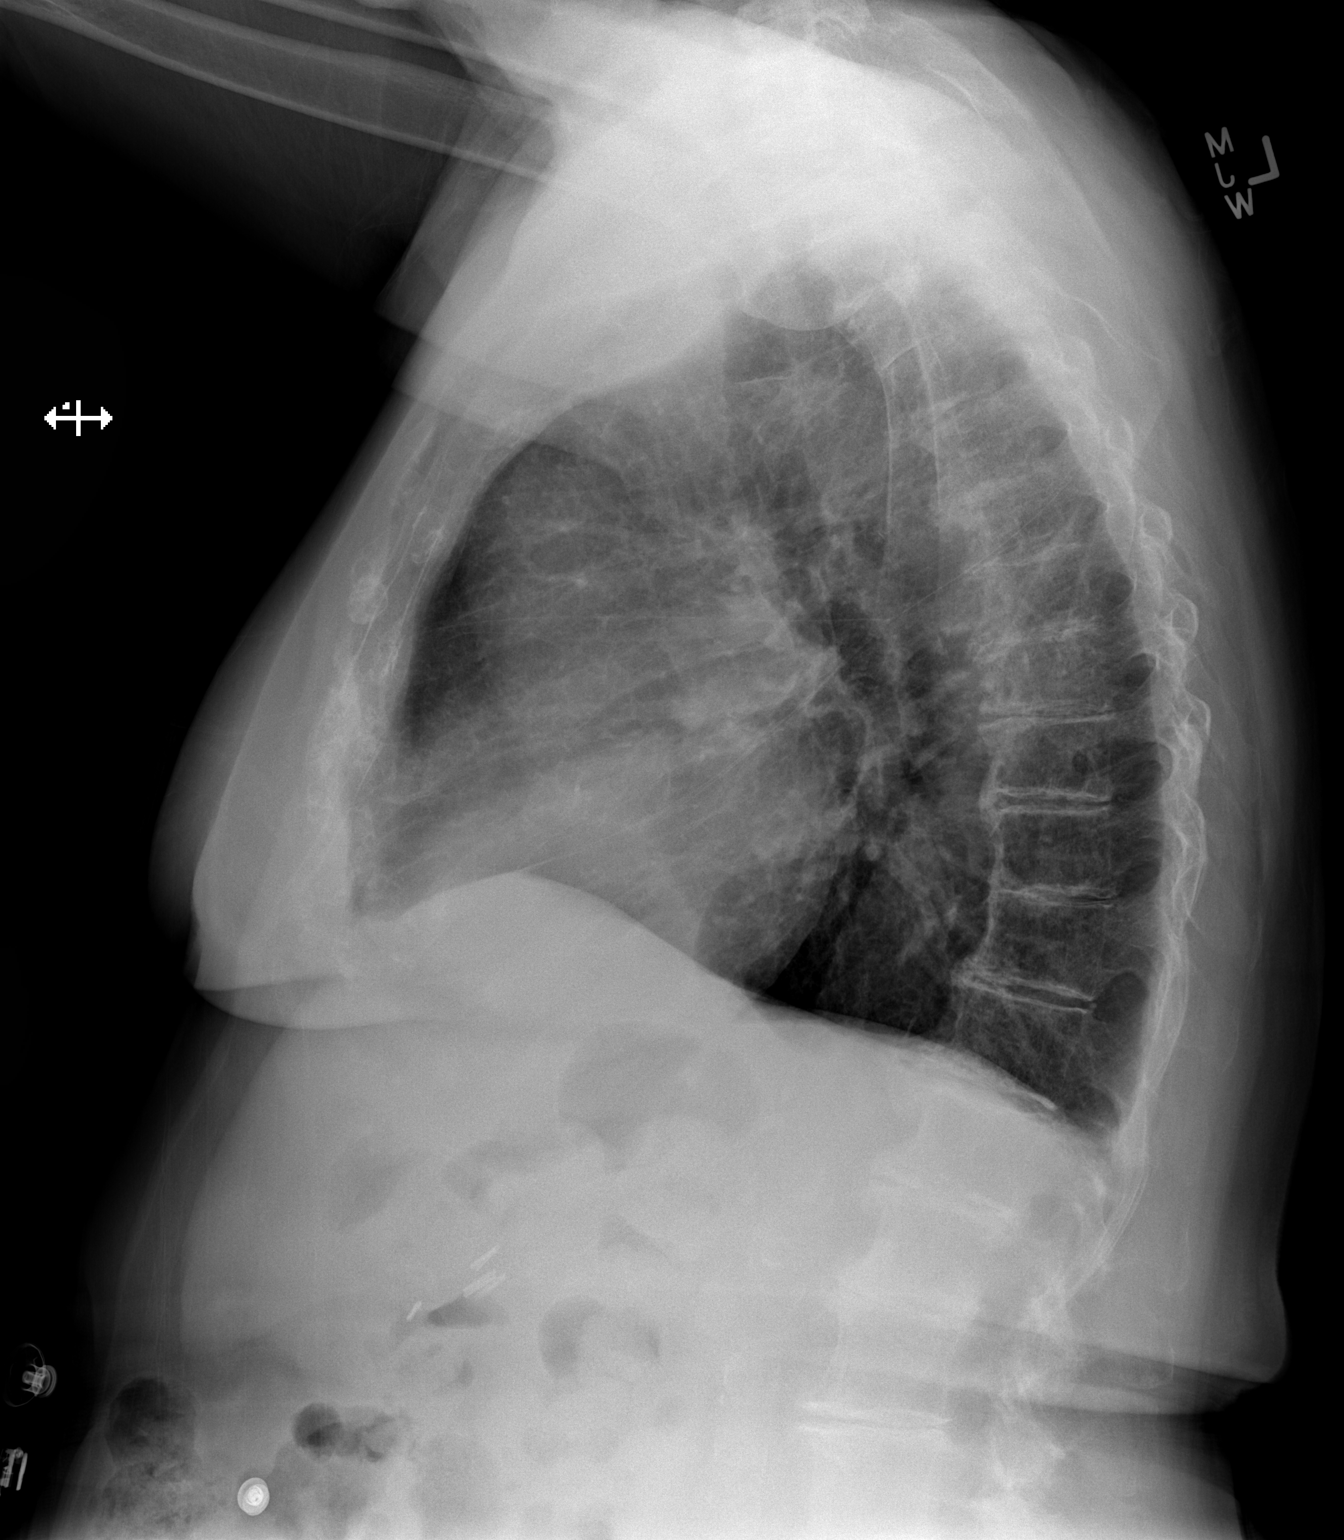

[2 of 2 positions shown; findings below may reference images not displayed]

FINDINGS: Mediastinum and hilar structures are normal. Cardiomegaly. Very mild
pulmonary interstitial prominence noted. Very mild component
congestive heart failure cannot be entirely excluded. Mild
interstitial pneumonitis cannot be excluded. No pleural effusion or
pneumothorax. No acute bony abnormality . Surgical clips upper
abdomen.
IMPRESSION: 1. Cardiomegaly.
2. Mild bilateral pulmonary interstitial prominence. Very mild
interstitial edema or interstitial pneumonitis cannot be completely
excluded .

## 2018-12-18 DIAGNOSIS — H26493 Other secondary cataract, bilateral: Secondary | ICD-10-CM | POA: Diagnosis not present

## 2018-12-18 DIAGNOSIS — Z961 Presence of intraocular lens: Secondary | ICD-10-CM | POA: Diagnosis not present

## 2018-12-18 DIAGNOSIS — H43813 Vitreous degeneration, bilateral: Secondary | ICD-10-CM | POA: Diagnosis not present

## 2018-12-18 DIAGNOSIS — H52203 Unspecified astigmatism, bilateral: Secondary | ICD-10-CM | POA: Diagnosis not present

## 2019-01-09 DIAGNOSIS — I10 Essential (primary) hypertension: Secondary | ICD-10-CM | POA: Diagnosis not present

## 2019-01-09 DIAGNOSIS — R69 Illness, unspecified: Secondary | ICD-10-CM | POA: Diagnosis not present

## 2019-01-09 DIAGNOSIS — Z1389 Encounter for screening for other disorder: Secondary | ICD-10-CM | POA: Diagnosis not present

## 2019-01-09 DIAGNOSIS — G5601 Carpal tunnel syndrome, right upper limb: Secondary | ICD-10-CM | POA: Diagnosis not present

## 2019-01-09 DIAGNOSIS — M810 Age-related osteoporosis without current pathological fracture: Secondary | ICD-10-CM | POA: Diagnosis not present

## 2019-01-09 DIAGNOSIS — R7301 Impaired fasting glucose: Secondary | ICD-10-CM | POA: Diagnosis not present

## 2019-01-09 DIAGNOSIS — M152 Bouchard's nodes (with arthropathy): Secondary | ICD-10-CM | POA: Diagnosis not present

## 2019-01-09 DIAGNOSIS — Z0000 Encounter for general adult medical examination without abnormal findings: Secondary | ICD-10-CM | POA: Diagnosis not present

## 2019-01-24 DIAGNOSIS — J069 Acute upper respiratory infection, unspecified: Secondary | ICD-10-CM | POA: Diagnosis not present

## 2019-01-24 DIAGNOSIS — B309 Viral conjunctivitis, unspecified: Secondary | ICD-10-CM | POA: Diagnosis not present

## 2019-07-15 DIAGNOSIS — I10 Essential (primary) hypertension: Secondary | ICD-10-CM | POA: Diagnosis not present

## 2019-08-11 DIAGNOSIS — Z23 Encounter for immunization: Secondary | ICD-10-CM | POA: Diagnosis not present

## 2019-12-24 ENCOUNTER — Ambulatory Visit: Payer: Medicare Other

## 2020-01-02 ENCOUNTER — Ambulatory Visit: Payer: Medicare HMO | Attending: Internal Medicine

## 2020-01-02 DIAGNOSIS — Z23 Encounter for immunization: Secondary | ICD-10-CM

## 2020-01-02 NOTE — Progress Notes (Signed)
   Covid-19 Vaccination Clinic  Name:  Christina Green    MRN: [id number] DOB: 12-24-1926  01/02/2020  [name] was observed post Covid-19 immunization for 15 minutes without incidence. She was provided with Vaccine Information Sheet and instruction to access the V-Safe system.   [name] was instructed to call 911 with any severe reactions post vaccine: Marland Kitchen Difficulty breathing  . Swelling of your face and throat  . A fast heartbeat  . A bad rash all over your body  . Dizziness and weakness    Immunizations Administered    Name Date Dose VIS Date Route   Pfizer COVID-19 Vaccine 01/02/2020 11:48 AM 0.3 mL 11/07/2019 Intramuscular   Manufacturer: [place]   Lot: [id number]   [place]: [id number]

## 2020-01-04 ENCOUNTER — Ambulatory Visit: Payer: Medicare Other

## 2020-01-27 ENCOUNTER — Ambulatory Visit: Payer: Medicare HMO | Attending: Internal Medicine

## 2020-01-27 DIAGNOSIS — Z23 Encounter for immunization: Secondary | ICD-10-CM | POA: Insufficient documentation

## 2020-01-27 NOTE — Progress Notes (Signed)
   Covid-19 Vaccination Clinic  Name:  Christina Green    MRN: [id number] DOB: [birth year]  01/27/2020  Christina Green was observed post Covid-19 immunization for 15 minutes without incident. She was provided with Vaccine Information Sheet and instruction to access the V-Safe system.   Christina Green was instructed to call 911 with any severe reactions post vaccine: Marland Kitchen Difficulty breathing  . Swelling of face and throat  . A fast heartbeat  . A bad rash all over body  . Dizziness and weakness   Immunizations Administered    Name Date Dose VIS Date Route   Pfizer COVID-19 Vaccine 01/27/2020 11:49 AM 0.3 mL 11/07/2019 Intramuscular   Manufacturer: [place]   Lot: [id number]   [place]: [id number]

## 2020-09-11 ENCOUNTER — Other Ambulatory Visit: Payer: Self-pay

## 2020-09-11 ENCOUNTER — Ambulatory Visit: Payer: Medicare HMO | Attending: Internal Medicine

## 2020-09-11 DIAGNOSIS — Z23 Encounter for immunization: Secondary | ICD-10-CM

## 2020-09-11 NOTE — Progress Notes (Signed)
   Covid-19 Vaccination Clinic  Name:  Christina Green    MRN: [id number] DOB: Jul 22, 1927  09/11/2020  Christina Green was observed post Covid-19 immunization for 15 minutes without incident. She was provided with Vaccine Information Sheet and instruction to access the V-Safe system.   Christina Green was instructed to call 911 with any severe reactions post vaccine: Marland Kitchen Difficulty breathing  . Swelling of face and throat  . A fast heartbeat  . A bad rash all over body  . Dizziness and weakness

## 2022-10-10 ENCOUNTER — Other Ambulatory Visit: Payer: Self-pay

## 2022-10-10 ENCOUNTER — Ambulatory Visit
Admission: RE | Admit: 2022-10-10 | Discharge: 2022-10-10 | Disposition: A | Discharge status: Home / Self Care | Payer: Medicare HMO | Source: Ambulatory Visit | Attending: Physician Assistant | Admitting: Physician Assistant

## 2022-10-10 DIAGNOSIS — J9601 Acute respiratory failure with hypoxia: Secondary | ICD-10-CM

## 2022-10-10 DIAGNOSIS — R0602 Shortness of breath: Secondary | ICD-10-CM

## 2022-10-27 ENCOUNTER — Ambulatory Visit
Admission: RE | Admit: 2022-10-27 | Discharge: 2022-10-27 | Disposition: A | Discharge status: Home / Self Care | Payer: Medicare HMO | Source: Ambulatory Visit | Attending: Internal Medicine | Admitting: Internal Medicine

## 2022-10-27 ENCOUNTER — Other Ambulatory Visit: Payer: Self-pay | Admitting: Internal Medicine

## 2022-10-27 DIAGNOSIS — R0609 Other forms of dyspnea: Secondary | ICD-10-CM

## 2022-11-28 ENCOUNTER — Other Ambulatory Visit: Payer: Self-pay | Admitting: Physician Assistant

## 2022-11-28 ENCOUNTER — Ambulatory Visit
Admission: RE | Admit: 2022-11-28 | Discharge: 2022-11-28 | Disposition: A | Discharge status: Home / Self Care | Payer: Medicare HMO | Source: Ambulatory Visit | Attending: Physician Assistant | Admitting: Physician Assistant

## 2022-11-28 DIAGNOSIS — Z8701 Personal history of pneumonia (recurrent): Secondary | ICD-10-CM

## 2023-02-14 ENCOUNTER — Encounter (HOSPITAL_COMMUNITY): Payer: Self-pay | Admitting: Family Medicine

## 2023-02-14 ENCOUNTER — Inpatient Hospital Stay (HOSPITAL_COMMUNITY)
Admission: EM | Admit: 2023-02-14 | Discharge: 2023-02-19 | DRG: 291 | Disposition: A | Discharge status: Home / Self Care | Payer: Medicare HMO | Attending: Internal Medicine | Admitting: Internal Medicine

## 2023-02-14 ENCOUNTER — Emergency Department (HOSPITAL_COMMUNITY): Payer: Medicare HMO

## 2023-02-14 ENCOUNTER — Other Ambulatory Visit: Payer: Self-pay

## 2023-02-14 DIAGNOSIS — N179 Acute kidney failure, unspecified: Secondary | ICD-10-CM

## 2023-02-14 DIAGNOSIS — J189 Pneumonia, unspecified organism: Secondary | ICD-10-CM | POA: Diagnosis present

## 2023-02-14 DIAGNOSIS — H9190 Unspecified hearing loss, unspecified ear: Secondary | ICD-10-CM | POA: Diagnosis present

## 2023-02-14 DIAGNOSIS — R739 Hyperglycemia, unspecified: Secondary | ICD-10-CM | POA: Diagnosis not present

## 2023-02-14 DIAGNOSIS — Z823 Family history of stroke: Secondary | ICD-10-CM | POA: Diagnosis not present

## 2023-02-14 DIAGNOSIS — Z880 Allergy status to penicillin: Secondary | ICD-10-CM | POA: Diagnosis not present

## 2023-02-14 DIAGNOSIS — R0603 Acute respiratory distress: Principal | ICD-10-CM

## 2023-02-14 DIAGNOSIS — I110 Hypertensive heart disease with heart failure: Secondary | ICD-10-CM | POA: Diagnosis present

## 2023-02-14 DIAGNOSIS — I2489 Other forms of acute ischemic heart disease: Secondary | ICD-10-CM | POA: Diagnosis present

## 2023-02-14 DIAGNOSIS — I10 Essential (primary) hypertension: Secondary | ICD-10-CM | POA: Diagnosis not present

## 2023-02-14 DIAGNOSIS — I5023 Acute on chronic systolic (congestive) heart failure: Secondary | ICD-10-CM | POA: Diagnosis present

## 2023-02-14 DIAGNOSIS — Z96653 Presence of artificial knee joint, bilateral: Secondary | ICD-10-CM | POA: Diagnosis present

## 2023-02-14 DIAGNOSIS — E1165 Type 2 diabetes mellitus with hyperglycemia: Secondary | ICD-10-CM | POA: Diagnosis present

## 2023-02-14 DIAGNOSIS — J9601 Acute respiratory failure with hypoxia: Secondary | ICD-10-CM | POA: Diagnosis present

## 2023-02-14 DIAGNOSIS — D3500 Benign neoplasm of unspecified adrenal gland: Secondary | ICD-10-CM | POA: Diagnosis present

## 2023-02-14 DIAGNOSIS — I77810 Thoracic aortic ectasia: Secondary | ICD-10-CM | POA: Diagnosis present

## 2023-02-14 DIAGNOSIS — E876 Hypokalemia: Secondary | ICD-10-CM | POA: Diagnosis not present

## 2023-02-14 DIAGNOSIS — E0500 Thyrotoxicosis with diffuse goiter without thyrotoxic crisis or storm: Secondary | ICD-10-CM | POA: Diagnosis present

## 2023-02-14 DIAGNOSIS — Z66 Do not resuscitate: Secondary | ICD-10-CM | POA: Diagnosis present

## 2023-02-14 DIAGNOSIS — I5041 Acute combined systolic (congestive) and diastolic (congestive) heart failure: Secondary | ICD-10-CM | POA: Diagnosis not present

## 2023-02-14 DIAGNOSIS — Z79899 Other long term (current) drug therapy: Secondary | ICD-10-CM | POA: Diagnosis not present

## 2023-02-14 DIAGNOSIS — R7989 Other specified abnormal findings of blood chemistry: Secondary | ICD-10-CM | POA: Diagnosis not present

## 2023-02-14 DIAGNOSIS — Z1152 Encounter for screening for COVID-19: Secondary | ICD-10-CM

## 2023-02-14 DIAGNOSIS — E785 Hyperlipidemia, unspecified: Secondary | ICD-10-CM | POA: Diagnosis present

## 2023-02-14 DIAGNOSIS — E871 Hypo-osmolality and hyponatremia: Secondary | ICD-10-CM | POA: Diagnosis present

## 2023-02-14 DIAGNOSIS — Z8249 Family history of ischemic heart disease and other diseases of the circulatory system: Secondary | ICD-10-CM | POA: Diagnosis not present

## 2023-02-14 DIAGNOSIS — L409 Psoriasis, unspecified: Secondary | ICD-10-CM | POA: Diagnosis present

## 2023-02-14 DIAGNOSIS — E1169 Type 2 diabetes mellitus with other specified complication: Secondary | ICD-10-CM | POA: Diagnosis not present

## 2023-02-14 DIAGNOSIS — I447 Left bundle-branch block, unspecified: Secondary | ICD-10-CM | POA: Diagnosis present

## 2023-02-14 DIAGNOSIS — F32A Depression, unspecified: Secondary | ICD-10-CM | POA: Diagnosis present

## 2023-02-14 DIAGNOSIS — I509 Heart failure, unspecified: Secondary | ICD-10-CM

## 2023-02-14 DIAGNOSIS — R0902 Hypoxemia: Secondary | ICD-10-CM

## 2023-02-14 LAB — CBC
HCT: 39.2 % (ref 36.0–46.0)
Hemoglobin: 13.3 g/dL (ref 12.0–15.0)
MCH: 31.9 pg (ref 26.0–34.0)
MCHC: 33.9 g/dL (ref 30.0–36.0)
MCV: 94 fL (ref 80.0–100.0)
Platelets: 366 10*3/uL (ref 150–400)
RBC: 4.17 MIL/uL (ref 3.87–5.11)
RDW: 12.8 % (ref 11.5–15.5)
WBC: 11 10*3/uL — ABNORMAL HIGH (ref 4.0–10.5)
nRBC: 0 % (ref 0.0–0.2)

## 2023-02-14 LAB — COMPREHENSIVE METABOLIC PANEL
ALT: 26 U/L (ref 0–44)
AST: 36 U/L (ref 15–41)
Albumin: 3.8 g/dL (ref 3.5–5.0)
Alkaline Phosphatase: 83 U/L (ref 38–126)
Anion gap: 15 (ref 5–15)
BUN: 11 mg/dL (ref 8–23)
CO2: 24 mmol/L (ref 22–32)
Calcium: 9.1 mg/dL (ref 8.9–10.3)
Chloride: 95 mmol/L — ABNORMAL LOW (ref 98–111)
Creatinine, Ser: 0.88 mg/dL (ref 0.44–1.00)
GFR, Estimated: >60 mL/min (ref >60)
Glucose, Bld: 207 mg/dL — ABNORMAL HIGH (ref 70–99)
Potassium: 3.7 mmol/L (ref 3.5–5.1)
Sodium: 134 mmol/L — ABNORMAL LOW (ref 135–145)
Total Bilirubin: 1 mg/dL (ref 0.3–1.2)
Total Protein: 7 g/dL (ref 6.5–8.1)

## 2023-02-14 LAB — I-STAT VENOUS BLOOD GAS, ED
Acid-Base Excess: 0 mmol/L (ref 0.0–2.0)
Bicarbonate: 26.3 mmol/L (ref 20.0–28.0)
Calcium, Ion: 1.15 mmol/L (ref 1.15–1.40)
HCT: 38 % (ref 36.0–46.0)
Hemoglobin: 12.9 g/dL (ref 12.0–15.0)
O2 Saturation: 93 %
Potassium: 3.5 mmol/L (ref 3.5–5.1)
Sodium: 133 mmol/L — ABNORMAL LOW (ref 135–145)
TCO2: 28 mmol/L (ref 22–32)
pCO2, Ven: 50.8 mmHg (ref 44–60)
pH, Ven: 7.322 (ref 7.25–7.43)
pO2, Ven: 73 mmHg — ABNORMAL HIGH (ref 32–45)

## 2023-02-14 LAB — RESP PANEL BY RT-PCR (RSV, FLU A&B, COVID)  RVPGX2
Influenza A by PCR: NEGATIVE
Influenza B by PCR: NEGATIVE
Resp Syncytial Virus by PCR: NEGATIVE
SARS Coronavirus 2 by RT PCR: NEGATIVE

## 2023-02-14 LAB — GLUCOSE, CAPILLARY: Glucose-Capillary: 262 mg/dL — ABNORMAL HIGH (ref 70–99)

## 2023-02-14 LAB — TROPONIN I (HIGH SENSITIVITY)
Troponin I (High Sensitivity): 32 ng/L — ABNORMAL HIGH (ref <18)
Troponin I (High Sensitivity): 90 ng/L — ABNORMAL HIGH (ref <18)

## 2023-02-14 LAB — BRAIN NATRIURETIC PEPTIDE: B Natriuretic Peptide: 1073.4 pg/mL — ABNORMAL HIGH (ref 0.0–100.0)

## 2023-02-14 MED ORDER — FUROSEMIDE 10 MG/ML IJ SOLN
60.0000 mg | INTRAMUSCULAR | Status: AC
  Administered 2023-02-14: 60 mg via INTRAVENOUS
  Filled 2023-02-14: qty 6

## 2023-02-14 MED ORDER — SODIUM CHLORIDE 0.9% FLUSH
3.0000 mL | Freq: Two times a day (BID) | INTRAVENOUS | Status: DC
  Administered 2023-02-14 – 2023-02-19 (×9): 3 mL via INTRAVENOUS

## 2023-02-14 MED ORDER — SODIUM CHLORIDE 0.9 % IV SOLN
2.0000 g | INTRAVENOUS | Status: AC
  Administered 2023-02-14: 2 g via INTRAVENOUS
  Filled 2023-02-14: qty 20

## 2023-02-14 MED ORDER — SENNOSIDES-DOCUSATE SODIUM 8.6-50 MG PO TABS: 1.0000 | ORAL_TABLET | Freq: Every evening | ORAL | Status: DC | PRN

## 2023-02-14 MED ORDER — ACETAMINOPHEN 325 MG PO TABS: 650.0000 mg | ORAL_TABLET | Freq: Four times a day (QID) | ORAL | Status: DC | PRN

## 2023-02-14 MED ORDER — HYDROCHLOROTHIAZIDE 12.5 MG PO TABS: 12.5000 mg | ORAL_TABLET | Freq: Every day | ORAL | Status: DC

## 2023-02-14 MED ORDER — LOSARTAN POTASSIUM-HCTZ 100-12.5 MG PO TABS: 1.0000 | ORAL_TABLET | Freq: Every day | ORAL | Status: DC

## 2023-02-14 MED ORDER — INSULIN ASPART 100 UNIT/ML IJ SOLN
0.0000 [IU] | Freq: Every day | INTRAMUSCULAR | Status: DC
  Administered 2023-02-14: 3 [IU] via SUBCUTANEOUS

## 2023-02-14 MED ORDER — ACETAMINOPHEN 650 MG RE SUPP: 650.0000 mg | Freq: Four times a day (QID) | RECTAL | Status: DC | PRN

## 2023-02-14 MED ORDER — DULOXETINE HCL 60 MG PO CPEP
60.0000 mg | ORAL_CAPSULE | Freq: Every day | ORAL | Status: DC
  Administered 2023-02-15 – 2023-02-19 (×5): 60 mg via ORAL
  Filled 2023-02-14 (×5): qty 1

## 2023-02-14 MED ORDER — SODIUM CHLORIDE 0.9 % IV SOLN
100.0000 mg | Freq: Once | INTRAVENOUS | Status: AC
  Administered 2023-02-14: 100 mg via INTRAVENOUS
  Filled 2023-02-14: qty 100

## 2023-02-14 MED ORDER — LOSARTAN POTASSIUM 50 MG PO TABS
100.0000 mg | ORAL_TABLET | Freq: Every day | ORAL | Status: DC
  Administered 2023-02-15: 100 mg via ORAL
  Filled 2023-02-14: qty 2

## 2023-02-14 MED ORDER — INSULIN ASPART 100 UNIT/ML IJ SOLN
0.0000 [IU] | Freq: Three times a day (TID) | INTRAMUSCULAR | Status: DC
  Administered 2023-02-15: 1 [IU] via SUBCUTANEOUS
  Administered 2023-02-17: 2 [IU] via SUBCUTANEOUS
  Administered 2023-02-17: 1 [IU] via SUBCUTANEOUS
  Administered 2023-02-17: 2 [IU] via SUBCUTANEOUS
  Administered 2023-02-18 (×2): 1 [IU] via SUBCUTANEOUS
  Administered 2023-02-19: 2 [IU] via SUBCUTANEOUS

## 2023-02-14 MED ORDER — LABETALOL HCL 200 MG PO TABS
200.0000 mg | ORAL_TABLET | Freq: Two times a day (BID) | ORAL | Status: DC
  Administered 2023-02-15 – 2023-02-16 (×3): 200 mg via ORAL
  Filled 2023-02-14 (×3): qty 1

## 2023-02-14 MED ORDER — FUROSEMIDE 10 MG/ML IJ SOLN
40.0000 mg | Freq: Two times a day (BID) | INTRAMUSCULAR | Status: DC
  Administered 2023-02-15 – 2023-02-17 (×5): 40 mg via INTRAVENOUS
  Filled 2023-02-14 (×5): qty 4

## 2023-02-14 MED ORDER — ENOXAPARIN SODIUM 40 MG/0.4ML IJ SOSY
40.0000 mg | PREFILLED_SYRINGE | INTRAMUSCULAR | Status: DC
  Administered 2023-02-15 – 2023-02-16 (×2): 40 mg via SUBCUTANEOUS
  Filled 2023-02-14 (×2): qty 0.4

## 2023-02-14 NOTE — ED Triage Notes (Signed)
Pt coming from home, called out due to shortness of breath. On arrival 40RR suspected pneumonia, sats in the 80 Duo neb with improvement.  10mg  albuterol Wheezing

## 2023-02-14 NOTE — ED Notes (Signed)
Christina Green  the neighbor   [phone] call if pt will be discharged the daug. Vaughan Basta is the contact person her number is in the chart

## 2023-02-14 NOTE — ED Notes (Signed)
Admitting physician notified face to face of increased trop level now 90 states he will order a third and an echo for tomorrow morning

## 2023-02-14 NOTE — ED Notes (Signed)
**Note Christina-Identified via Obfuscation** ED TO INPATIENT HANDOFF REPORT  ED Nurse Name and Phone #: [phone] Auda Finfrock  S Name/Age/Gender Christina Green 89 y.o. female Room/Bed: 010C/010C  Code Status   Code Status: DNR  Home/SNF/Other Home Patient oriented to: self, place, time, and situation Is this baseline? Yes   Triage Complete: Triage complete  Chief Complaint Acute CHF (congestive heart failure) ([place]) [I50.9]  Triage Note Pt coming from home, called out due to shortness of breath. On arrival 40RR suspected pneumonia, sats in the 80 Duo neb with improvement.  10mg  albuterol Wheezing    Allergies Allergies  Allergen Reactions   Penicillins Other (See Comments)    unknown    Level of Care/Admitting Diagnosis ED Disposition     ED Disposition  Admit   Condition  --   Bandana: [place] [100100]  Level of Care: Progressive [102]  Admit to Progressive based on following criteria: RESPIRATORY PROBLEMS hypoxemic/hypercapnic respiratory failure that is responsive to NIPPV (BiPAP) or High Flow Nasal Cannula (6-80 lpm). Frequent assessment/intervention, no > Q2 hrs < Q4 hrs, to maintain oxygenation and pulmonary hygiene.  May admit patient to [facility] or [facility] if equivalent level of care is available:: Yes  Covid Evaluation: Confirmed COVID Negative  Diagnosis: Acute CHF (congestive heart failure) [facility]) [id number]  Admitting Physician: Vianne Bulls [id number]  Attending Physician: Vianne Bulls [id number]  Certification:: I certify this patient will need inpatient services for at least 2 midnights  Estimated Length of Stay: 3          B Medical/Surgery History Past Medical History:  Diagnosis Date   Adrenal adenoma    benign   Depression    takes Cymbalta daily   Diarrhea    "often" takes Immodium as needed   DJD (degenerative joint disease) of knee    and hands   Graves disease    Headache    nightly   History of kidney stones    History of  shingles    Hyperlipemia    takes Atorvastatin daily   Hypertension    takes Hyzaar and Labetalol daily   Impaired hearing    wears hearing aides   Joint pain    Joint swelling    Left bundle branch block (LBBB)    identified in 2011   Psoriasis    Past Surgical History:  Procedure Laterality Date   CARDIAC CATHETERIZATION     CATARACT EXTRACTION     CHOLECYSTECTOMY     colonscopy     KNEE ARTHROSCOPY W/ MENISCAL REPAIR Right    REPLACEMENT TOTAL KNEE Left    TOTAL KNEE ARTHROPLASTY Right 01/31/2016   Procedure: RIGHT TOTAL KNEE ARTHROPLASTY;  Surgeon: Dorna Leitz, MD;  Location: [place];  Service: Orthopedics;  Laterality: Right;     A IV Location/Drains/Wounds Patient Lines/Drains/Airways Status     Active Line/Drains/Airways     Name Placement date Placement time Site Days   Peripheral IV 02/14/23 18 G Right Antecubital 02/14/23  1715  Antecubital  less than 1            Intake/Output Last 24 hours  Intake/Output Summary (Last 24 hours) at 02/14/2023 2005 Last data filed at 02/14/2023 2004 Gross per 24 hour  Intake 250 ml  Output --  Net 250 ml    Labs/Imaging Results for orders placed or performed during the hospital encounter of 02/14/23 (from the past 48 hour(s))  Comprehensive metabolic panel     Status: Abnormal  Collection Time: 02/14/23  4:45 PM  Result Value Ref Range   Sodium 134 (L) 135 - 145 mmol/L   Potassium 3.7 3.5 - 5.1 mmol/L   Chloride 95 (L) 98 - 111 mmol/L   CO2 24 22 - 32 mmol/L   Glucose, Bld 207 (H) 70 - 99 mg/dL    Comment: Glucose reference range applies only to samples taken after fasting for at least 8 hours.   BUN 11 8 - 23 mg/dL   Creatinine, Ser 0.88 0.44 - 1.00 mg/dL   Calcium 9.1 8.9 - 10.3 mg/dL   Total Protein 7.0 6.5 - 8.1 g/dL   Albumin 3.8 3.5 - 5.0 g/dL   AST 36 15 - 41 U/L   ALT 26 0 - 44 U/L   Alkaline Phosphatase 83 38 - 126 U/L   Total Bilirubin 1.0 0.3 - 1.2 mg/dL   GFR, Estimated >60 >60 mL/min    Comment:  (NOTE) Calculated using the CKD-EPI Creatinine Equation (2021)    Anion gap 15 5 - 15    Comment: Performed at [facility] [address] [place], [place] [address]  CBC     Status: Abnormal   Collection Time: 02/14/23  4:45 PM  Result Value Ref Range   WBC 11.0 (H) 4.0 - 10.5 K/uL   RBC 4.17 3.87 - 5.11 MIL/uL   Hemoglobin 13.3 12.0 - 15.0 g/dL   HCT 39.2 36.0 - 46.0 %   MCV 94.0 80.0 - 100.0 fL   MCH 31.9 26.0 - 34.0 pg   MCHC 33.9 30.0 - 36.0 g/dL   RDW 12.8 11.5 - 15.5 %   Platelets 366 150 - 400 K/uL   nRBC 0.0 0.0 - 0.2 %    Comment: Performed at [facility], [place] [address], [place], [place] [address]  Brain natriuretic peptide     Status: Abnormal   Collection Time: 02/14/23  4:45 PM  Result Value Ref Range   B Natriuretic Peptide 1,073.4 (H) 0.0 - 100.0 pg/mL    Comment: Performed at [facility] [address], [place], [place] [address]  Troponin I (High Sensitivity)     Status: Abnormal   Collection Time: 02/14/23  4:45 PM  Result Value Ref Range   Troponin I (High Sensitivity) 32 (H) <18 ng/L    Comment: (NOTE) Elevated high sensitivity troponin I (hsTnI) values and significant  changes across serial measurements may suggest ACS but many other  chronic and acute conditions are known to elevate hsTnI results.  Refer to the "Links" section for chest pain algorithms and additional  guidance. Performed at [facility], [place] [address] [place],  [address]   Resp panel by RT-PCR (RSV, Flu A&B, Covid) Anterior Nasal Swab     Status: None   Collection Time: 02/14/23  4:48 PM   Specimen: Anterior Nasal Swab  Result Value Ref Range   SARS Coronavirus 2 by RT PCR NEGATIVE NEGATIVE   Influenza A by PCR NEGATIVE NEGATIVE   Influenza B by PCR NEGATIVE NEGATIVE    Comment: (NOTE) The Xpert Xpress SARS-CoV-2/FLU/RSV plus assay is intended as an aid in the diagnosis of influenza from Nasopharyngeal swab specimens and should not be used as  a sole basis for treatment. Nasal washings and aspirates are unacceptable for Xpert Xpress SARS-CoV-2/FLU/RSV testing.  Fact Sheet for Patients: EntrepreneurPulse.com.au  Fact Sheet for Healthcare Providers: IncredibleEmployment.be  This test is not yet approved or cleared by the [place]  States FDA and has been authorized for detection and/or diagnosis of SARS-CoV-2 by FDA under an Emergency Use Authorization (EUA). This EUA will remain in effect (meaning this test can be used) for the duration of the COVID-19 declaration under Section 564(b)(1) of the Act, 21 U.S.C. section 360bbb-3(b)(1), unless the authorization is terminated or revoked.     Resp Syncytial Virus by PCR NEGATIVE NEGATIVE    Comment: (NOTE) Fact Sheet for Patients: EntrepreneurPulse.com.au  Fact Sheet for Healthcare Providers: IncredibleEmployment.be  This test is not yet approved or cleared by the [place] FDA and has been authorized for detection and/or diagnosis of SARS-CoV-2 by FDA under an Emergency Use Authorization (EUA). This EUA will remain in effect (meaning this test can be used) for the duration of the COVID-19 declaration under Section 564(b)(1) of the Act, 21 U.S.C. section 360bbb-3(b)(1), unless the authorization is terminated or revoked.  Performed at [facility], [place] [address] [place], [place] [address]   I-Stat venous blood gas, ED     Status: Abnormal   Collection Time: 02/14/23  5:30 PM  Result Value Ref Range   pH, Ven 7.322 7.25 - 7.43   pCO2, Ven 50.8 44 - 60 mmHg   pO2, Ven 73 (H) 32 - 45 mmHg   Bicarbonate 26.3 20.0 - 28.0 mmol/L   TCO2 28 22 - 32 mmol/L   O2 Saturation 93 %   Acid-Base Excess 0.0 0.0 - 2.0 mmol/L   Sodium 133 (L) 135 - 145 mmol/L   Potassium 3.5 3.5 - 5.1 mmol/L   Calcium, Ion 1.15 1.15 - 1.40 mmol/L   HCT 38.0 36.0 - 46.0 %   Hemoglobin 12.9 12.0 - 15.0 g/dL   Sample type  VENOUS    DG Chest Port 1 View  Result Date: 02/14/2023 CLINICAL DATA:  Shortness of breath EXAM: PORTABLE CHEST 1 VIEW COMPARISON:  Previous studies including the examination of 11/28/2022 FINDINGS: Transverse diameter of heart is increased. Central pulmonary vessels are more prominent. There is interval increase in interstitial markings in both lungs, especially in parahilar regions and lower lung fields. There is no focal pulmonary consolidation. There is blunting of both lateral CP angles. There is no pneumothorax. IMPRESSION: Cardiomegaly. CHF with interstitial pulmonary edema. Small bilateral pleural effusions. Electronically Signed   By: Elmer Picker M.D.   On: 02/14/2023 17:15    Pending Labs Unresulted Labs (From admission, onward)     Start     Ordered   02/21/23 0500  Creatinine, serum  (enoxaparin (LOVENOX)    CrCl >/= 30 ml/min)  Weekly,   R     Comments: while on enoxaparin therapy    02/14/23 1956   02/15/23 0500  Hemoglobin A1c  Tomorrow morning,   R       Comments: To assess prior glycemic control    02/14/23 1956   02/15/23 [id number]  Basic metabolic panel  Daily,   R      02/14/23 1956   02/15/23 0500  Magnesium  Tomorrow morning,   R        02/14/23 1956   02/15/23 0500  CBC  Daily,   R      02/14/23 1956   02/14/23 1712  Blood culture (routine x 2)  BLOOD CULTURE X 2,   R      02/14/23 1711            Vitals/Pain Today's Vitals   02/14/23 1930 02/14/23 1944 02/14/23 1945 02/14/23 2000  BP: Marland Kitchen)  154/78  (!) 157/88 (!) 173/93  Pulse: 75 87 79 96  Resp: (!) 22 20 (!) 24 (!) 30  Temp:    97.8 F (36.6 C)  TempSrc:    Oral  SpO2: 98% 97% 95% 95%  Weight:      Height:        Isolation Precautions No active isolations  Medications Medications  labetalol (NORMODYNE) tablet 200 mg (has no administration in time range)  DULoxetine (CYMBALTA) DR capsule 60 mg (has no administration in time range)  insulin aspart (novoLOG) injection 0-6 Units (has no  administration in time range)  insulin aspart (novoLOG) injection 0-5 Units (has no administration in time range)  furosemide (LASIX) injection 40 mg (has no administration in time range)  enoxaparin (LOVENOX) injection 40 mg (has no administration in time range)  sodium chloride flush (NS) 0.9 % injection 3 mL (has no administration in time range)  acetaminophen (TYLENOL) tablet 650 mg (has no administration in time range)    Or  acetaminophen (TYLENOL) suppository 650 mg (has no administration in time range)  senna-docusate (Senokot-S) tablet 1 tablet (has no administration in time range)  losartan (COZAAR) tablet 100 mg (has no administration in time range)  hydrochlorothiazide (HYDRODIURIL) tablet 12.5 mg (has no administration in time range)  cefTRIAXone (ROCEPHIN) 2 g in sodium chloride 0.9 % 100 mL IVPB (0 g Intravenous Stopped 02/14/23 1804)  doxycycline (VIBRAMYCIN) 100 mg in sodium chloride 0.9 % 250 mL IVPB (0 mg Intravenous Stopped 02/14/23 2004)  furosemide (LASIX) injection 60 mg (60 mg Intravenous Given 02/14/23 1956)    Mobility walks with device     Focused Assessment respiratory     R Recommendations: See Admitting Provider Note  Report given to:   Additional Notes: Patient a/o x 4 ambulates with walker has purewick in place due to 60mg  lasix just given also back on bipap refuses lovenosx because of anemia hx

## 2023-02-14 NOTE — H&P (Signed)
History and Physical    Christina Green [id number] DOB: 1927-04-06 DOA: 02/14/2023  PCP: Lavone Orn, MD   Patient coming from: Home   Chief Complaint: SOB   HPI: Christina Green is a pleasant 89 y.o. female with medical history significant for hypertension, depression, hyperlipidemia, and chronic LBBB, presenting to the emergency department with shortness of breath.  Patient reports that she had pneumonia 4 months ago, has had persistent cough since then, but all other symptoms had resolved and she returned to her usual activities.  She then began to develop shortness of breath again on 02/10/2023.  Shortness of breath has progressively worsened and became severe even at rest today.  She denies any fevers, chills, or chest pain associated with this.  She has not noticed any leg swelling.  ED Course: Upon arrival to the ED, patient is found to be saturating well on BiPAP with FiO2 40, tachypnea, elevated with stable blood pressure.  EKG demonstrates sinus rhythm with PVCs and LBBB.  Chest x-ray notable for cardiomegaly, interstitial pulmonary edema, and small bilateral pleural effusions.  Labs are most notable for BNP 1073, troponin 32, and glucose 207.  Blood cultures were collected in the ED and the patient was treated with Rocephin, doxycycline, IV Lasix, and BiPAP.  Review of Systems:  All other systems reviewed and apart from HPI, are negative.  Past Medical History:  Diagnosis Date   Adrenal adenoma    benign   Depression    takes Cymbalta daily   Diarrhea    "often" takes Immodium as needed   DJD (degenerative joint disease) of knee    and hands   Graves disease    Headache    nightly   History of kidney stones    History of shingles    Hyperlipemia    takes Atorvastatin daily   Hypertension    takes Hyzaar and Labetalol daily   Impaired hearing    wears hearing aides   Joint pain    Joint swelling    Left bundle branch block (LBBB)    identified in 2011    Psoriasis     Past Surgical History:  Procedure Laterality Date   CARDIAC CATHETERIZATION     CATARACT EXTRACTION     CHOLECYSTECTOMY     colonscopy     KNEE ARTHROSCOPY W/ MENISCAL REPAIR Right    REPLACEMENT TOTAL KNEE Left    TOTAL KNEE ARTHROPLASTY Right 01/31/2016   Procedure: RIGHT TOTAL KNEE ARTHROPLASTY;  Surgeon: Dorna Leitz, MD;  Location: [facility];  Service: Orthopedics;  Laterality: Right;    Social History:   reports that she has never smoked. She does not have any smokeless tobacco history on file. She reports current alcohol use. She reports that she does not use drugs.  Allergies  Allergen Reactions   Penicillins Other (See Comments)    unknown    Family History  Problem Relation Age of Onset   Hypertension Mother    Heart disease Mother    Heart disease Father    Hypertension Father    Hypertension Sister    CVA Sister      Prior to Admission medications   Medication Sig Start Date End Date Taking? Authorizing Provider  amLODipine (NORVASC) 5 MG tablet Take 5 mg by mouth daily. For HTN    [provider]  aspirin EC 325 MG tablet Take 1 tablet (325 mg total) by mouth 2 (two) times daily after a meal. Take x 1 month  post op to decrease risk of blood clots. 01/31/16   Gary Fleet, PA-C  atorvastatin (LIPITOR) 40 MG tablet Take 40 mg by mouth daily. For hyperlipidemia 11/21/15   [provider]  docusate sodium (COLACE) 100 MG capsule Take 100 mg by mouth 2 (two) times daily. For constipation    [provider]  DULoxetine (CYMBALTA) 60 MG capsule Take 60 mg by mouth daily. For depression 12/28/15   [provider]  ferrous sulfate 325 (65 FE) MG tablet Take 1 tablet (325 mg total) by mouth 3 (three) times daily with meals. 02/10/16   Ngetich, Dinah C, NP  HYDROcodone-acetaminophen (NORCO) 5-325 MG tablet Take 1-2 tablets by mouth every 6 (six) hours as needed for severe pain. 01/31/16   Gary Fleet, PA-C  labetalol (NORMODYNE)  100 MG tablet Take 2 tablets by mouth 2 (two) times daily. For HTN 11/26/15   [provider]  losartan-hydrochlorothiazide (HYZAAR) 100-12.5 MG tablet Take 1 tablet by mouth daily. For HTN 10/28/15   [provider]  Omega-3 Fatty Acids (FISH OIL PO) Take 1 capsule by mouth daily.    [provider]  polyethylene glycol (MIRALAX / GLYCOLAX) packet Take 17 g by mouth daily as needed for mild constipation or moderate constipation.     [provider]    Physical Exam: Vitals:   02/14/23 1800 02/14/23 1900 02/14/23 1915 02/14/23 1944  BP: (!) 153/82 (!) 154/88 (!) 157/83   Pulse: 80 78 76 87  Resp: (!) 22 (!) 24 (!) 26 20  SpO2: 98% 98% 99% 97%  Weight:      Height:        Constitutional: NAD, calm  Eyes: PERTLA, lids and conjunctivae normal ENMT: Mucous membranes are moist. Posterior pharynx clear of any exudate or lesions.   Neck: supple, no masses  Respiratory: fine rales b/l, no wheezing. Accessory muscle use.  Cardiovascular: S1 & S2 heard, regular rate and rhythm. No extremity edema.  Abdomen: No distension, no tenderness, soft. Bowel sounds active.  Musculoskeletal: no clubbing / cyanosis. No joint deformity upper and lower extremities.   Skin: no significant rashes, lesions, ulcers. Warm, dry, well-perfused. Neurologic: CN 2-12 grossly intact. Moving all extremities. Alert and oriented.  Psychiatric: Pleasant. Cooperative.    Labs and Imaging on Admission: I have personally reviewed following labs and imaging studies  CBC: Recent Labs  Lab 02/14/23 1645 02/14/23 1730  WBC 11.0*  --   HGB 13.3 12.9  HCT 39.2 38.0  MCV 94.0  --   PLT 366  --    Basic Metabolic Panel: Recent Labs  Lab 02/14/23 1645 02/14/23 1730  NA 134* 133*  K 3.7 3.5  CL 95*  --   CO2 24  --   GLUCOSE 207*  --   BUN 11  --   CREATININE 0.88  --   CALCIUM 9.1  --    GFR: Estimated Creatinine Clearance: 31.3 mL/min (by C-G formula based on SCr of 0.88  mg/dL). Liver Function Tests: Recent Labs  Lab 02/14/23 1645  AST 36  ALT 26  ALKPHOS 83  BILITOT 1.0  PROT 7.0  ALBUMIN 3.8   No results for input(s): "LIPASE", "AMYLASE" in the last 168 hours. No results for input(s): "AMMONIA" in the last 168 hours. Coagulation Profile: No results for input(s): "INR", "PROTIME" in the last 168 hours. Cardiac Enzymes: No results for input(s): "CKTOTAL", "CKMB", "CKMBINDEX", "TROPONINI" in the last 168 hours. BNP (last 3 results) No results for input(s): "  PROBNP" in the last 8760 hours. HbA1C: No results for input(s): "HGBA1C" in the last 72 hours. CBG: No results for input(s): "GLUCAP" in the last 168 hours. Lipid Profile: No results for input(s): "CHOL", "HDL", "LDLCALC", "TRIG", "CHOLHDL", "LDLDIRECT" in the last 72 hours. Thyroid Function Tests: No results for input(s): "TSH", "T4TOTAL", "FREET4", "T3FREE", "THYROIDAB" in the last 72 hours. Anemia Panel: No results for input(s): "VITAMINB12", "FOLATE", "FERRITIN", "TIBC", "IRON", "RETICCTPCT" in the last 72 hours. Urine analysis:    Component Value Date/Time   COLORURINE YELLOW 01/19/2016 1040   APPEARANCEUR CLEAR 01/19/2016 1040   LABSPEC 1.013 01/19/2016 1040   PHURINE 7.0 01/19/2016 1040   GLUCOSEU NEGATIVE 01/19/2016 1040   HGBUR NEGATIVE 01/19/2016 1040   BILIRUBINUR NEGATIVE 01/19/2016 1040   KETONESUR NEGATIVE 01/19/2016 1040   PROTEINUR NEGATIVE 01/19/2016 1040   UROBILINOGEN 0.2 06/25/2013 1013   NITRITE NEGATIVE 01/19/2016 1040   LEUKOCYTESUR NEGATIVE 01/19/2016 1040   Sepsis Labs: @LABRCNTIP (procalcitonin:4,lacticidven:4) ) Recent Results (from the past 240 hour(s))  Resp panel by RT-PCR (RSV, Flu A&B, Covid) Anterior Nasal Swab     Status: None   Collection Time: 02/14/23  4:48 PM   Specimen: Anterior Nasal Swab  Result Value Ref Range Status   SARS Coronavirus 2 by RT PCR NEGATIVE NEGATIVE Final   Influenza A by PCR NEGATIVE NEGATIVE Final   Influenza B by PCR  NEGATIVE NEGATIVE Final    Comment: (NOTE) The Xpert Xpress SARS-CoV-2/FLU/RSV plus assay is intended as an aid in the diagnosis of influenza from Nasopharyngeal swab specimens and should not be used as a sole basis for treatment. Nasal washings and aspirates are unacceptable for Xpert Xpress SARS-CoV-2/FLU/RSV testing.  Fact Sheet for Patients: EntrepreneurPulse.com.au  Fact Sheet for Healthcare Providers: IncredibleEmployment.be  This test is not yet approved or cleared by the [place] FDA and has been authorized for detection and/or diagnosis of SARS-CoV-2 by FDA under an Emergency Use Authorization (EUA). This EUA will remain in effect (meaning this test can be used) for the duration of the COVID-19 declaration under Section 564(b)(1) of the Act, 21 U.S.C. section 360bbb-3(b)(1), unless the authorization is terminated or revoked.     Resp Syncytial Virus by PCR NEGATIVE NEGATIVE Final    Comment: (NOTE) Fact Sheet for Patients: EntrepreneurPulse.com.au  Fact Sheet for Healthcare Providers: IncredibleEmployment.be  This test is not yet approved or cleared by the [place] FDA and has been authorized for detection and/or diagnosis of SARS-CoV-2 by FDA under an Emergency Use Authorization (EUA). This EUA will remain in effect (meaning this test can be used) for the duration of the COVID-19 declaration under Section 564(b)(1) of the Act, 21 U.S.C. section 360bbb-3(b)(1), unless the authorization is terminated or revoked.  Performed at [facility], [place] [address] [place], [place] [address]      Radiological Exams on Admission: DG Chest Port 1 View  Result Date: 02/14/2023 CLINICAL DATA:  Shortness of breath EXAM: PORTABLE CHEST 1 VIEW COMPARISON:  Previous studies including the examination of 11/28/2022 FINDINGS: Transverse diameter of heart is increased. Central pulmonary vessels are  more prominent. There is interval increase in interstitial markings in both lungs, especially in parahilar regions and lower lung fields. There is no focal pulmonary consolidation. There is blunting of both lateral CP angles. There is no pneumothorax. IMPRESSION: Cardiomegaly. CHF with interstitial pulmonary edema. Small bilateral pleural effusions. Electronically Signed   By: Elmer Picker M.D.   On: 02/14/2023 17:15    EKG: Independently reviewed. Sinus rhythm,  PVCs, LBBB.   Assessment/Plan   1. Acute CHF; acute hypoxic respiratory failure  - Presents in respiratory distress with hypoxia and found to have BNP 1073 and pulmonary edema on CXR  - She was started on BiPAP in ED and given 60 mg IV Lasix  - Continue BiPAP for now, continue diuresis with 40 mg IV Lasix q12h, obtain echocardiogram, monitor weight and I/Os, monitor renal function and electrolytes    2. Hyperglycemia  - Serum glucose is 207 in ED  - Likely stress reaction vs new DM  - Check A1c, check CBGs and use low-intensity SSI if needed   3. Hypertension  - Continue labetalol and losartan    4. Elevated troponin  - Troponin was 32 and then 90 in the ED  - She denies any recent chest discomfort, has chronic LBBB on EKG  - Likely demand ischemia in setting of acute CHF/acute respiratory failure - Treat CHF as above, trend troponin, follow-up echo findings    DVT prophylaxis: Lovenox  Code Status: DNR Level of Care: Level of care: Progressive Family Communication: None present   Disposition Plan:  Patient is from: home  Anticipated d/c is to: TBD Anticipated d/c date is: 02/17/23 Patient currently: Pending improved/stable respiratory status  Consults called: none  Admission status: Inpatient     Vianne Bulls, MD [facility]  02/14/2023, 7:56 PM

## 2023-02-14 NOTE — ED Provider Notes (Signed)
Christina Green Provider Note   CSN: [id number] Arrival date & time: 02/14/23  1643     History  Chief Complaint  Patient presents with   Shortness of Breath    Christina Green is a 89 y.o. female.  89 yo F with a history of hypertension, hyperlipidemia, and left bundle branch block who presents to the emergency department in respiratory distress.  Patient reports that she started having symptoms on Sunday including shortness of breath.  Says that she did not had any chest pain recently.  Says that she did have a prior presentation that was similar due to pneumonia several years ago.  No history of CHF or COPD.  No tobacco use.  Per EMS he was hypoxic on arrival and was started on BiPAP.  Was given nebulizers, magnesium, 125 mg of Solu-Medrol with improvement of her symptoms.  History limited by respiratory distress.  Says that she has had a cough but that is chronic.  No fevers that she is aware of.  Was being treated for pneumonia with azithromycin and took her last pill today.  No leg swelling.  Is not on inhalers at home.  Not on Lasix or other diuretics at home either.       Home Medications Prior to Admission medications   Medication Sig Start Date End Date Taking? Authorizing Provider  amLODipine (NORVASC) 5 MG tablet Take 5 mg by mouth daily. For HTN    [provider]  aspirin EC 325 MG tablet Take 1 tablet (325 mg total) by mouth 2 (two) times daily after a meal. Take x 1 month post op to decrease risk of blood clots. 01/31/16   Gary Fleet, PA-C  atorvastatin (LIPITOR) 40 MG tablet Take 40 mg by mouth daily. For hyperlipidemia 11/21/15   [provider]  docusate sodium (COLACE) 100 MG capsule Take 100 mg by mouth 2 (two) times daily. For constipation    [provider]  DULoxetine (CYMBALTA) 60 MG capsule Take 60 mg by mouth daily. For depression 12/28/15   [provider]  ferrous sulfate 325 (65  FE) MG tablet Take 1 tablet (325 mg total) by mouth 3 (three) times daily with meals. 02/10/16   Ngetich, Dinah C, NP  HYDROcodone-acetaminophen (NORCO) 5-325 MG tablet Take 1-2 tablets by mouth every 6 (six) hours as needed for severe pain. 01/31/16   Gary Fleet, PA-C  labetalol (NORMODYNE) 100 MG tablet Take 2 tablets by mouth 2 (two) times daily. For HTN 11/26/15   [provider]  losartan-hydrochlorothiazide (HYZAAR) 100-12.5 MG tablet Take 1 tablet by mouth daily. For HTN 10/28/15   [provider]  Omega-3 Fatty Acids (FISH OIL PO) Take 1 capsule by mouth daily.    [provider]  polyethylene glycol (MIRALAX / GLYCOLAX) packet Take 17 g by mouth daily as needed for mild constipation or moderate constipation.     [provider]      Allergies    Penicillins    Review of Systems   Review of Systems  Physical Exam Updated Vital Signs BP (!) 157/83   Pulse 76   Resp (!) 26   Ht 4\' 11"  (1.499 m)   Wt 62.1 kg   SpO2 99%   BMI 27.65 kg/m  Physical Exam Vitals and nursing note reviewed.  Constitutional:      General: She is in acute distress.     Appearance: She is well-developed. She is ill-appearing.  HENT:     Head: Normocephalic and atraumatic.     Right Ear: External ear normal.     Left Ear: External ear normal.     Nose: Nose normal.  Eyes:     Extraocular Movements: Extraocular movements intact.     Conjunctiva/sclera: Conjunctivae normal.     Pupils: Pupils are equal, round, and reactive to light.  Cardiovascular:     Rate and Rhythm: Normal rate and regular rhythm.     Heart sounds: No murmur heard. Pulmonary:     Effort: Respiratory distress present.     Breath sounds: Normal breath sounds. No wheezing.     Comments: On BiPAP.  Taken off briefly and only able to speak in 1-2 word sentences and started to become hypoxic.  Diminished breath sounds. Abdominal:     General: Abdomen is flat. There is no distension.      Palpations: Abdomen is soft. There is no mass.     Tenderness: There is no abdominal tenderness. There is no guarding.  Musculoskeletal:     Cervical back: Normal range of motion and neck supple.     Right lower leg: No edema.     Left lower leg: No edema.  Skin:    General: Skin is warm and dry.  Neurological:     Mental Status: She is alert and oriented to person, place, and time. Mental status is at baseline.  Psychiatric:        Mood and Affect: Mood normal.     ED Results / Procedures / Treatments   Labs (all labs ordered are listed, but only abnormal results are displayed) Labs Reviewed  COMPREHENSIVE METABOLIC PANEL - Abnormal; Notable for the following components:      Result Value   Sodium 134 (*)    Chloride 95 (*)    Glucose, Bld 207 (*)    All other components within normal limits  CBC - Abnormal; Notable for the following components:   WBC 11.0 (*)    All other components within normal limits  BRAIN NATRIURETIC PEPTIDE - Abnormal; Notable for the following components:   B Natriuretic Peptide 1,073.4 (*)    All other components within normal limits  I-STAT VENOUS BLOOD GAS, ED - Abnormal; Notable for the following components:   pO2, Ven 73 (*)    Sodium 133 (*)    All other components within normal limits  TROPONIN I (HIGH SENSITIVITY) - Abnormal; Notable for the following components:   Troponin I (High Sensitivity) 32 (*)    All other components within normal limits  RESP PANEL BY RT-PCR (RSV, FLU A&B, COVID)  RVPGX2  CULTURE, BLOOD (ROUTINE X 2)  CULTURE, BLOOD (ROUTINE X 2)  BLOOD GAS, VENOUS  TROPONIN I (HIGH SENSITIVITY)    EKG EKG Interpretation  Date/Time:  Wednesday February 14 2023 16:54:46 EDT Ventricular Rate:  96 PR Interval:  168 QRS Duration: 155 QT Interval:  390 QTC Calculation: 493 R Axis:   -53 Text Interpretation: Sinus rhythm Multiform ventricular premature complexes Left bundle branch block Confirmed by Margaretmary Eddy [phone]) on  02/14/2023 5:00:15 PM  Radiology DG Chest Port 1 View  Result Date: 02/14/2023 CLINICAL DATA:  Shortness of breath EXAM: PORTABLE CHEST 1 VIEW COMPARISON:  Previous studies including the examination of 11/28/2022 FINDINGS: Transverse diameter of heart is increased. Central pulmonary vessels are more prominent. There is interval increase in interstitial markings in both lungs, especially in parahilar regions and lower lung fields. There is no focal  pulmonary consolidation. There is blunting of both lateral CP angles. There is no pneumothorax. IMPRESSION: Cardiomegaly. CHF with interstitial pulmonary edema. Small bilateral pleural effusions. Electronically Signed   By: Elmer Picker M.D.   On: 02/14/2023 17:15    Procedures Procedures   Medications Ordered in ED Medications  doxycycline (VIBRAMYCIN) 100 mg in sodium chloride 0.9 % 250 mL IVPB (100 mg Intravenous New Bag/Given 02/14/23 1804)  furosemide (LASIX) injection 60 mg (has no administration in time range)  cefTRIAXone (ROCEPHIN) 2 g in sodium chloride 0.9 % 100 mL IVPB (0 g Intravenous Stopped 02/14/23 1804)    ED Course/ Medical Decision Making/ A&P Clinical Course as of 02/14/23 1938  Wed Feb 14, 2023  1717 Confirmed DNR/DNI with the patient. [RP]  1933 Dr Myna Hidalgo from hospitalist to admit the patient. [RP]    Clinical Course User Index [RP] Fransico Meadow, MD                             Medical Decision Making Amount and/or Complexity of Data Reviewed Labs: ordered. Radiology: ordered.  Risk Prescription drug management. Decision regarding hospitalization.   Christina Green is a 89 y.o. female with comorbidities that complicate the patient evaluation including hypertension, hyperlipidemia, and pneumonia who presents to the emergency department in respiratory distress  Initial Ddx:  Pneumonia, URI, sepsis, CHF exacerbation, COPD exacerbation, MI  MDM:  Feel the patient may have developed respiratory distress  from pneumonia.  Will send blood Cultures and started on IV antibiotics after chest x-ray.  Does not have any known history of COPD or tobacco use that would be suggestive of COPD exacerbation.  I am not hearing significant wheezing at this time either so we will withhold additional COPD treatments.  Will send BNP and obtain chest x-ray to evaluate for volume overload that would be suggestive of CHF but does not have any significant lower extremity edema or history of such.  Plan:  Labs Blood cultures BNP Troponin EKG Chest x-ray BiPAP  ED Summary/Re-evaluation:  Patient reassessed and was much improved on the BiPAP.  Had a chest x-ray which appeared on my read does show right greater than left basilar infiltrate so she was given ceftriaxone and doxycycline for possible community-acquired pneumonia.  Did show pulmonary edema on the chest x-ray and BNP was elevated so feel that she may have new onset heart failure.  Electrolytes were WNL and so patient was given Lasix and was discussed with medicine for admission to step down.  Did discuss with the patient that she would like to be DNR/DNI at this time.  This patient presents to the ED for concern of complaints listed in HPI, this involves an extensive number of treatment options, and is a complaint that carries with it a high risk of complications and morbidity. Disposition including potential need for admission considered.   Dispo: Admit to Step Down  Additional history obtained from EMS Records reviewed Outpatient Clinic Notes The following labs were independently interpreted: Chemistry and show no acute abnormality I independently reviewed the following imaging with scope of interpretation limited to determining acute life threatening conditions related to emergency care: Chest x-ray and agree with the radiologist interpretation with the following exceptions: Possible right greater than left basilar infiltrate I personally reviewed and  interpreted cardiac monitoring: normal sinus rhythm  I personally reviewed and interpreted the pt's EKG: see above for interpretation  I have reviewed the patients home medications  and made adjustments as needed Consults: Hospitalist Social Determinants of health:  Elderly  Final Clinical Impression(s) / ED Diagnoses Final diagnoses:  Respiratory distress  Acute on chronic congestive heart failure, unspecified heart failure type ([place])  Hypoxia  Community acquired pneumonia, unspecified laterality    Rx / DC Orders ED Discharge Orders     None      CRITICAL CARE Performed by: Fransico Meadow   Total critical care time: 45 minutes  Critical care time was exclusive of separately billable procedures and treating other patients.  Critical care was necessary to treat or prevent imminent or life-threatening deterioration.  Critical care was time spent personally by me on the following activities: development of treatment plan with patient and/or surrogate as well as nursing, discussions with consultants, evaluation of patient's response to treatment, examination of patient, obtaining history from patient or surrogate, ordering and performing treatments and interventions, ordering and review of laboratory studies, ordering and review of radiographic studies, pulse oximetry and re-evaluation of patient's condition.    Fransico Meadow, MD 02/14/23 [phone]

## 2023-02-14 NOTE — ED Notes (Signed)
Assumed care of pt who arrived from home c/o sob. Patient pending admission for possible pneumonia. Patient currently on cpap with even non labored respirations. Will continue to treat and monitor as directed

## 2023-02-15 ENCOUNTER — Inpatient Hospital Stay (HOSPITAL_COMMUNITY): Payer: Medicare HMO

## 2023-02-15 DIAGNOSIS — I509 Heart failure, unspecified: Secondary | ICD-10-CM | POA: Diagnosis not present

## 2023-02-15 DIAGNOSIS — J9601 Acute respiratory failure with hypoxia: Secondary | ICD-10-CM | POA: Diagnosis not present

## 2023-02-15 DIAGNOSIS — I447 Left bundle-branch block, unspecified: Secondary | ICD-10-CM

## 2023-02-15 DIAGNOSIS — I5041 Acute combined systolic (congestive) and diastolic (congestive) heart failure: Secondary | ICD-10-CM

## 2023-02-15 DIAGNOSIS — R7989 Other specified abnormal findings of blood chemistry: Secondary | ICD-10-CM | POA: Diagnosis not present

## 2023-02-15 DIAGNOSIS — I10 Essential (primary) hypertension: Secondary | ICD-10-CM | POA: Diagnosis not present

## 2023-02-15 LAB — CBC
HCT: 37.4 % (ref 36.0–46.0)
Hemoglobin: 13.2 g/dL (ref 12.0–15.0)
MCH: 31.6 pg (ref 26.0–34.0)
MCHC: 35.3 g/dL (ref 30.0–36.0)
MCV: 89.5 fL (ref 80.0–100.0)
Platelets: 296 10*3/uL (ref 150–400)
RBC: 4.18 MIL/uL (ref 3.87–5.11)
RDW: 12.7 % (ref 11.5–15.5)
WBC: 10.1 10*3/uL (ref 4.0–10.5)
nRBC: 0 % (ref 0.0–0.2)

## 2023-02-15 LAB — BASIC METABOLIC PANEL
Anion gap: 11 (ref 5–15)
BUN: 12 mg/dL (ref 8–23)
CO2: 27 mmol/L (ref 22–32)
Calcium: 8.5 mg/dL — ABNORMAL LOW (ref 8.9–10.3)
Chloride: 95 mmol/L — ABNORMAL LOW (ref 98–111)
Creatinine, Ser: 0.73 mg/dL (ref 0.44–1.00)
GFR, Estimated: >60 mL/min (ref >60)
Glucose, Bld: 180 mg/dL — ABNORMAL HIGH (ref 70–99)
Potassium: 2.8 mmol/L — ABNORMAL LOW (ref 3.5–5.1)
Sodium: 133 mmol/L — ABNORMAL LOW (ref 135–145)

## 2023-02-15 LAB — GLUCOSE, CAPILLARY
Glucose-Capillary: 163 mg/dL — ABNORMAL HIGH (ref 70–99)
Glucose-Capillary: 131 mg/dL — ABNORMAL HIGH (ref 70–99)
Glucose-Capillary: 131 mg/dL — ABNORMAL HIGH (ref 70–99)
Glucose-Capillary: 175 mg/dL — ABNORMAL HIGH (ref 70–99)

## 2023-02-15 LAB — HEMOGLOBIN A1C
Hgb A1c MFr Bld: 6.8 % — ABNORMAL HIGH (ref 4.8–5.6)
Mean Plasma Glucose: 148 mg/dL

## 2023-02-15 LAB — ECHOCARDIOGRAM COMPLETE
AV Mean grad: 5 mmHg
AV Peak grad: 8.5 mmHg
Ao pk vel: 1.46 m/s
Area-P 1/2: 3.99 cm2
Height: 59 in
S' Lateral: 3.9 cm
Single Plane A4C EF: 34.4 %
Weight: 2056.45 oz

## 2023-02-15 LAB — MAGNESIUM: Magnesium: 2.3 mg/dL (ref 1.7–2.4)

## 2023-02-15 LAB — TROPONIN I (HIGH SENSITIVITY)
Troponin I (High Sensitivity): 165 ng/L (ref <18)
Troponin I (High Sensitivity): 358 ng/L (ref <18)

## 2023-02-15 MED ORDER — PERFLUTREN LIPID MICROSPHERE
1.0000 mL | INTRAVENOUS | Status: AC | PRN
  Administered 2023-02-15: 2 mL via INTRAVENOUS

## 2023-02-15 MED ORDER — ASPIRIN 81 MG PO TBEC
81.0000 mg | DELAYED_RELEASE_TABLET | Freq: Every day | ORAL | Status: DC
  Administered 2023-02-15 – 2023-02-19 (×5): 81 mg via ORAL
  Filled 2023-02-15 (×5): qty 1

## 2023-02-15 MED ORDER — LIVING BETTER WITH HEART FAILURE BOOK: Freq: Once | Status: AC

## 2023-02-15 MED ORDER — SACUBITRIL-VALSARTAN 24-26 MG PO TABS
1.0000 | ORAL_TABLET | Freq: Two times a day (BID) | ORAL | Status: DC
  Administered 2023-02-16 – 2023-02-19 (×6): 1 via ORAL
  Filled 2023-02-15 (×6): qty 1

## 2023-02-15 MED ORDER — POTASSIUM CHLORIDE CRYS ER 20 MEQ PO TBCR
40.0000 meq | EXTENDED_RELEASE_TABLET | Freq: Three times a day (TID) | ORAL | Status: AC
  Administered 2023-02-15 – 2023-02-16 (×4): 40 meq via ORAL
  Filled 2023-02-15 (×3): qty 2
  Filled 2023-02-15: qty 4

## 2023-02-15 NOTE — Consult Note (Addendum)
Cardiology Consultation   Patient ID: AMARISS DETAMORE MRN: [id number]; DOB: Sep 10, 1927  Admit date: 02/14/2023 Date of Consult: 02/15/2023  PCP:  Lavone Orn, [facility] Prov[facility] Cardiologist:  None        Patient Profile:   Christina Green is a 89 y.o. female with a hx of HTN, HLD, depression, Graves' disease, psoriasis, chronic LBBB, reported benign adrenal adenoma, impaired hearing who is being seen 02/15/2023 for the evaluation of CHF with EF 30-35% at the request of Dr. Sloan Leiter.  History of Present Illness:   Christina Green has no prior cardiac history. She has an LBBB documented on prior EKG but does not recall any prior workup for this. She does not have a history of smoking. She drinks 1/2 glass of wine a few times a week. Her parents passed away from congestive heart failure. She lives independently, cooks and cleans for herself, though her daughter and her husband live 2 yards away to look in on her.   She was diagnosed with pneumonia in November of this year and reports after treatment she felt better but never fully back to herself. She would notice on her twice-daily walks down the driveway that she would have to stop and rest due to SOB. This week the SOB worsened and she initially thought she had pneumonia again. Yesterday the SOB became profoundly worse so EMS was called and she was found to be hypoxic in the 80s, received nebs and albuterol. Initial labs notable for BNP 1073, normal Cr/Hgb, Covid/flu/RSV negative, hsTroponin 32->90->165->358, mild hyponatremia. CXR showed cardiomegaly, CHF with interstitial pulmonary edema, small bilateral pleural effusions. Initial BP 150s-170s, but peak of 205/120. She required BiPAP for a period of time, but was able to be transitioned down to [place] O2 after diuresis. She received 60mg  IV Lasix yesterday transitioned to 40mg  IV BID. F/u K 2.8 prompting repletion by medicine team. EKG showed what looks like NSR with LBBB 96bpm  with occasional PVCs/PACs. Telemetry more clearly confirmed NSR, with brief gradual sinus tach last night when she had to go back on BiPAP for a period of time. She denies ever having chest pain. No edema, palpitations, abdominal distension. She does not weigh herself regularly. 2D echo showed EF 30-35% with WMA (inferior septum, entire inferior wall, and apex are akinetic, entire anterior wall, entire lateral wall, and anterior septum are hypokinetic), grade 1 DD, mild LAE, trivial MR, aortic sclerosis without stenosis, mild dilation of ascending aorta (47mm).   She is currently feeling much better than when she arrived to the hospital but not quite back to baseline.  Past Medical History:  Diagnosis Date   Adrenal adenoma    benign   Depression    takes Cymbalta daily   Diarrhea    "often" takes Immodium as needed   DJD (degenerative joint disease) of knee    and hands   Graves disease    Headache    nightly   History of kidney stones    History of shingles    Hyperlipemia    takes Atorvastatin daily   Hypertension    takes Hyzaar and Labetalol daily   Impaired hearing    wears hearing aides   Joint pain    Joint swelling    Left bundle branch block (LBBB)    identified in 2011   Psoriasis     Past Surgical History:  Procedure Laterality Date   CARDIAC CATHETERIZATION     CATARACT EXTRACTION  CHOLECYSTECTOMY     colonscopy     KNEE ARTHROSCOPY W/ MENISCAL REPAIR Right    REPLACEMENT TOTAL KNEE Left    TOTAL KNEE ARTHROPLASTY Right 01/31/2016   Procedure: RIGHT TOTAL KNEE ARTHROPLASTY;  Surgeon: Dorna Leitz, MD;  Location: [facility];  Service: Orthopedics;  Laterality: Right;     Home Medications:  Prior to Admission medications   Medication Sig Start Date End Date Taking? Authorizing Provider  DULoxetine (CYMBALTA) 60 MG capsule Take 60 mg by mouth daily. For depression 12/28/15  Yes [provider]  labetalol (NORMODYNE) 100 MG tablet Take 2 tablets by mouth 2  (two) times daily. For HTN 11/26/15  Yes [provider]  losartan-hydrochlorothiazide (HYZAAR) 100-12.5 MG tablet Take 1 tablet by mouth daily. 10/28/15  Yes [provider]  Multiple Vitamins-Minerals (PRESERVISION AREDS PO) Take 1 capsule by mouth in the morning and at bedtime.   Yes [provider]  azithromycin (ZITHROMAX) 250 MG tablet Take 250 mg by mouth See admin instructions. Take 500 mg (2 tablets) by mouth on day 1, then 250 mg (1 tablet) thereafter until finished. Patient not taking: Reported on 02/15/2023 02/10/23   [provider]    Inpatient Medications: Scheduled Meds:  DULoxetine  60 mg Oral Daily   enoxaparin (LOVENOX) injection  40 mg Subcutaneous Q24H   furosemide  40 mg Intravenous Q12H   insulin aspart  0-5 Units Subcutaneous QHS   insulin aspart  0-6 Units Subcutaneous TID WC   labetalol  200 mg Oral BID   losartan  100 mg Oral Daily   potassium chloride  40 mEq Oral TID   sodium chloride flush  3 mL Intravenous Q12H   Continuous Infusions:  PRN Meds: acetaminophen **OR** acetaminophen, senna-docusate  Allergies:    Allergies  Allergen Reactions   Penicillins Other (See Comments)    unknown    Social History:   Social History   Socioeconomic History   Marital status: Widowed    Spouse name: Not on file   Number of children: Not on file   Years of education: Not on file   Highest education level: Not on file  Occupational History   Not on file  Tobacco Use   Smoking status: Never   Smokeless tobacco: Not on file  Substance and Sexual Activity   Alcohol use: Yes    Comment: wine occasionally   Drug use: No   Sexual activity: Not on file  Other Topics Concern   Not on file  Social History Narrative   Not on file   Social Determinants of Health   Financial Resource Strain: Not on file  Food Insecurity: Not on file  Transportation Needs: Not on file  Physical Activity: Not on file  Stress: Not on file   Social Connections: Not on file  Intimate Partner Violence: Not on file    Family History:   Family History  Problem Relation Age of Onset   Hypertension Mother    Heart disease Mother    Heart disease Father    Hypertension Father    Hypertension Sister    CVA Sister      ROS:  Please see the history of present illness.  All other ROS reviewed and negative.     Physical Exam/Data:   Vitals:   02/15/23 0404 02/15/23 0714 02/15/23 0832 02/15/23 1103  BP:  (!) 145/86  136/72  Pulse:  75 76 78  Resp:  (!) 21 20 18   Temp:  97.6  F (36.4 C)  97.8 F (36.6 C)  TempSrc:  Oral  Oral  SpO2:  99% 93% 95%  Weight: 58.3 kg     Height:        Intake/Output Summary (Last 24 hours) at 02/15/2023 1640 Last data filed at 02/15/2023 0324 Gross per 24 hour  Intake 253 ml  Output 1300 ml  Net -1047 ml      02/15/2023    4:04 AM 02/15/2023   12:29 AM 02/14/2023    9:02 PM  Last 3 Weights  Weight (lbs) 128 lb 8.5 oz 128 lb 8.5 oz 138 lb 0.1 oz  Weight (kg) 58.3 kg 58.3 kg 62.6 kg     Body mass index is 25.96 kg/m.  General: Well developed, well nourished elderly WF in no acute distress. Head: Normocephalic, atraumatic, sclera non-icteric, no xanthomas, nares are without discharge. Neck: Negative for carotid bruits. JVP not elevated. Lungs: Clear bilaterally to auscultation without wheezes, rales, or rhonchi. Breathing is unlabored. Heart: RRR S1 S2 without murmurs, rubs, or gallops.  Abdomen: Soft, non-tender, non-distended with normoactive bowel sounds. No rebound/guarding. Extremities: No clubbing or cyanosis. No edema. Distal pedal pulses are 2+ and equal bilaterally. Neuro: Alert and oriented X 3. Moves all extremities spontaneously. Psych:  Responds to questions appropriately with a normal affect.   EKG:  The EKG was personally reviewed and demonstrates:  Difficult to discern but appears NSR with LBBB 96bpm with occasional PVCs/PACs Telemetry:  Telemetry was personally  reviewed and demonstrates:  NSR, brief sinus tach last night  Relevant CV Studies: 2D echo 02/14/23  1. Left ventricular ejection fraction, by estimation, is 30 to 35%. The  left ventricle has moderately decreased function. The left ventricle  demonstrates regional wall motion abnormalities (see scoring  diagram/findings for description). There is mild  concentric left ventricular hypertrophy. Left ventricular diastolic  parameters are consistent with Grade I diastolic dysfunction (impaired  relaxation). Elevated left atrial pressure.   2. Right ventricular systolic function is normal. The right ventricular  size is normal.   3. Left atrial size was mildly dilated.   4. The mitral valve is normal in structure. Trivial mitral valve  regurgitation. No evidence of mitral stenosis.   5. The aortic valve is normal in structure. Aortic valve regurgitation is  mild. Aortic valve sclerosis is present, with no evidence of aortic valve  stenosis.   6. There is mild dilatation of the ascending aorta, measuring 38 mm.   7. The inferior vena cava is normal in size with greater than 50%  respiratory variability, suggesting right atrial pressure of 3 mmHg.    Laboratory Data:  High Sensitivity Troponin:   Recent Labs  Lab 02/14/23 1645 02/14/23 1910 02/14/23 2301 02/15/23 0024  TROPONINIHS 32* 90* 165* 358*     Chemistry Recent Labs  Lab 02/14/23 1645 02/14/23 1730 02/15/23 0024  NA 134* 133* 133*  K 3.7 3.5 2.8*  CL 95*  --  95*  CO2 24  --  27  GLUCOSE 207*  --  180*  BUN 11  --  12  CREATININE 0.88  --  0.73  CALCIUM 9.1  --  8.5*  MG  --   --  2.3  GFRNONAA >60  --  >60  ANIONGAP 15  --  11    Recent Labs  Lab 02/14/23 1645  PROT 7.0  ALBUMIN 3.8  AST 36  ALT 26  ALKPHOS 83  BILITOT 1.0   Lipids No results for input(s): "  CHOL", "TRIG", "HDL", "LABVLDL", "LDLCALC", "CHOLHDL" in the last 168 hours.  Hematology Recent Labs  Lab 02/14/23 1645 02/14/23 1730  02/15/23 0024  WBC 11.0*  --  10.1  RBC 4.17  --  4.18  HGB 13.3 12.9 13.2  HCT 39.2 38.0 37.4  MCV 94.0  --  89.5  MCH 31.9  --  31.6  MCHC 33.9  --  35.3  RDW 12.8  --  12.7  PLT 366  --  296   Thyroid No results for input(s): "TSH", "FREET4" in the last 168 hours.  BNP Recent Labs  Lab 02/14/23 1645  BNP 1,073.4*     Radiology/Studies:  ECHOCARDIOGRAM COMPLETE  Result Date: 02/15/2023    ECHOCARDIOGRAM REPORT   Patient Name:   Christina Green Dutson Date of Exam: 02/15/2023 Medical Rec #:  [id number]        Height:       59.0 in Accession #:    [id number]       Weight:       128.5 lb Date of Birth:  December 01, 1926         BSA:          1.528 m Patient Age:    21 years         BP:           145/86 mmHg Patient Gender: F                HR:           71 bpm. Exam Location:  Inpatient Procedure: 2D Echo, Intracardiac Opacification Agent, Cardiac Doppler and Color            Doppler Indications:    CHF  History:        Patient has no prior history of Echocardiogram examinations.                 CHF; Risk Factors:Hypertension.  Sonographer:    Marella Chimes Referring Phys: [id number] [place]  1. Left ventricular ejection fraction, by estimation, is 30 to 35%. The left ventricle has moderately decreased function. The left ventricle demonstrates regional wall motion abnormalities (see scoring diagram/findings for description). There is mild concentric left ventricular hypertrophy. Left ventricular diastolic parameters are consistent with Grade I diastolic dysfunction (impaired relaxation). Elevated left atrial pressure.  2. Right ventricular systolic function is normal. The right ventricular size is normal.  3. Left atrial size was mildly dilated.  4. The mitral valve is normal in structure. Trivial mitral valve regurgitation. No evidence of mitral stenosis.  5. The aortic valve is normal in structure. Aortic valve regurgitation is mild. Aortic valve sclerosis is present, with no evidence of  aortic valve stenosis.  6. There is mild dilatation of the ascending aorta, measuring 38 mm.  7. The inferior vena cava is normal in size with greater than 50% respiratory variability, suggesting right atrial pressure of 3 mmHg. FINDINGS  Left Ventricle: Left ventricular ejection fraction, by estimation, is 30 to 35%. The left ventricle has moderately decreased function. The left ventricle demonstrates regional wall motion abnormalities. The left ventricular internal cavity size was normal in size. There is mild concentric left ventricular hypertrophy. Left ventricular diastolic parameters are consistent with Grade I diastolic dysfunction (impaired relaxation). Elevated left atrial pressure.  LV Wall Scoring: The inferior septum, entire inferior wall, and apex are akinetic. The entire anterior wall, entire lateral wall, and anterior septum are hypokinetic. Right Ventricle: The right ventricular size is  normal. No increase in right ventricular wall thickness. Right ventricular systolic function is normal. Left Atrium: Left atrial size was mildly dilated. Right Atrium: Right atrial size was normal in size. Pericardium: There is no evidence of pericardial effusion. Presence of epicardial fat layer. Mitral Valve: The mitral valve is normal in structure. Trivial mitral valve regurgitation. No evidence of mitral valve stenosis. Tricuspid Valve: The tricuspid valve is normal in structure. Tricuspid valve regurgitation is not demonstrated. No evidence of tricuspid stenosis. Aortic Valve: The aortic valve is normal in structure. Aortic valve regurgitation is mild. Aortic valve sclerosis is present, with no evidence of aortic valve stenosis. Aortic valve mean gradient measures 5.0 mmHg. Aortic valve peak gradient measures 8.5  mmHg. Pulmonic Valve: The pulmonic valve was normal in structure. Pulmonic valve regurgitation is not visualized. No evidence of pulmonic stenosis. Aorta: There is mild dilatation of the ascending  aorta, measuring 38 mm. Venous: The inferior vena cava is normal in size with greater than 50% respiratory variability, suggesting right atrial pressure of 3 mmHg. IAS/Shunts: No atrial level shunt detected by color flow Doppler.  LEFT VENTRICLE PLAX 2D LVIDd:         4.60 cm      Diastology LVIDs:         3.90 cm      LV e' medial:    2.28 cm/s LV PW:         1.30 cm      LV E/e' medial:  38.2 LV IVS:        1.30 cm      LV e' lateral:   4.13 cm/s                             LV E/e' lateral: 21.1  LV Volumes (MOD) LV vol d, MOD A4C: 101.0 ml LV vol s, MOD A4C: 66.3 ml LV SV MOD A4C:     101.0 ml RIGHT VENTRICLE RV S prime:     10.20 cm/s TAPSE (M-mode): 1.4 cm LEFT ATRIUM             Index        RIGHT ATRIUM           Index LA diam:        3.80 cm 2.49 cm/m   RA Area:     10.80 cm LA Vol (A2C):   67.9 ml 44.43 ml/m  RA Volume:   22.40 ml  14.66 ml/m LA Vol (A4C):   44.3 ml 28.98 ml/m LA Biplane Vol: 57.6 ml 37.69 ml/m  AORTIC VALVE AV Vmax:           146.00 cm/s AV Vmean:          100.000 cm/s AV VTI:            0.317 m AV Peak Grad:      8.5 mmHg AV Mean Grad:      5.0 mmHg LVOT Vmax:         83.00 cm/s LVOT Vmean:        48.200 cm/s LVOT VTI:          0.169 m LVOT/AV VTI ratio: 0.53  AORTA Ao Root diam: 2.80 cm Ao Asc diam:  3.80 cm MITRAL VALVE MV Area (PHT): 3.99 cm     SHUNTS MV Decel Time: 190 msec     Systemic VTI: 0.17 m MV E velocity: 87.00 cm/s MV A velocity: 104.00  cm/s MV E/A ratio:  0.84 Kardie Tobb DO Electronically signed by Berniece Salines DO Signature Date/Time: 02/15/2023/11:24:27 AM    Final    DG Chest Port 1 View  Result Date: 02/14/2023 CLINICAL DATA:  Shortness of breath EXAM: PORTABLE CHEST 1 VIEW COMPARISON:  Previous studies including the examination of 11/28/2022 FINDINGS: Transverse diameter of heart is increased. Central pulmonary vessels are more prominent. There is interval increase in interstitial markings in both lungs, especially in parahilar regions and lower lung fields.  There is no focal pulmonary consolidation. There is blunting of both lateral CP angles. There is no pneumothorax. IMPRESSION: Cardiomegaly. CHF with interstitial pulmonary edema. Small bilateral pleural effusions. Electronically Signed   By: Elmer Picker M.D.   On: 02/14/2023 17:15     Assessment and Plan:   1. Acute hypoxic respiratory failure suspected due to acute HFrEF - EF 30-35% with WMA (inferior septum, entire inferior wall, and apex are akinetic, entire anterior wall, entire lateral wall, and anterior septum are hypokinetic), grade 1 DD, mild LAE, trivial MR, aortic sclerosis without stenosis, mild dilation of ascending aorta (1mm) - being diuresed - rec'd 60mg  IV yesterday then 40mg  IV BID planned today - transition losartan to Entresto 49/51mg  BID tomorrow - tolerating continuation of home labetalol - concomitant hypokalemia being managed by IM - etiology of cardiomyopathy not clear at this time - with WMA, concern for underlying CAD, but could also have LBBB-cardiomyopathy - functional status is not prohibitive of cath but increased risk with age, patient agreeable to any procedures necessary, states "the doctor knows best." Per preliminary review with Dr. Harrell Gave, likely plan medical therapy before considering cath  2. Accelerated HTN - BP improving, manage in context above  3. Elevated troponin - hsTroponin 32->90->165->358 - no chest pain - suspect demand ischemia as troponin elevation out of proportion to degree of LV dysfunction, see #1 - per d/w MD, add ASA 81mg  daily, hold off full dose heparin  4. Known LBBB - follow, may need to consider CRT if LV dysfunction persists  5. Mild dilation of ascending aorta - anticipate will be followed by echo to follow LV function but otherwise do not anticipate further evaluation   Risk Assessment/Risk Scores:     TIMI Risk Score for Unstable Angina or Non-ST Elevation MI:   The patient's TIMI risk score is 2, which  indicates a 8% risk of all cause mortality, new or recurrent myocardial infarction or need for urgent revascularization in the next 14 days.  New York Heart Association (NYHA) Functional Class NYHA Class IV on arrival         For questions or updates, please contact [facility] Please consult [email or web address] for contact info under    Signed, Charlie Pitter, PA-C  02/15/2023 4:40 PM

## 2023-02-15 NOTE — Evaluation (Signed)
Physical Therapy Evaluation Patient Details Name: Christina Green MRN: [id number] DOB: December 29, 1926 Today's Date: 02/15/2023  History of Present Illness  89 yo female with onset of SOB was admitted on 3/20 and found elevated troponin, cardiomegaly, pleural effusion and pulm edema, with CHF dx.  PMHx:  HTN, depression, HLD, L BBB, OA knee, shingles, HOH,  Clinical Impression  Pt was seen after admission from home alone in which she is at baseline driving and fully I.  Pt is now unsteady on her feet, requiring help to balance and stand, and with walker requires attendance at all times.  Her O2 sats were down on room air after initially being inconsistent, but ultimately O2 was replaced as she was down to 87% at rest in chair.  Follow her for goals of PT as outlined below, focusing on her skills to increase independence with recommendation first to SNF for strengthening and return to I living situation.  Pt has family who are out of town and has tentatively agreed to be in rehab setting for a short stay.       Recommendations for follow up therapy are one component of a multi-disciplinary discharge planning process, led by the attending physician.  Recommendations may be updated based on patient status, additional functional criteria and insurance authorization.  Follow Up Recommendations Skilled nursing-short term rehab (<3 hours/day) Can patient physically be transported by private vehicle: No    Assistance Recommended at Discharge Frequent or constant Supervision/Assistance  Patient can return home with the following  A lot of help with walking and/or transfers;A little help with bathing/dressing/bathroom;Assistance with cooking/housework;Assist for transportation;Help with stairs or ramp for entrance    Equipment Recommendations None recommended by PT  Recommendations for Other Services       Functional Status Assessment Patient has had a recent decline in their functional status and  demonstrates the ability to make significant improvements in function in a reasonable and predictable amount of time.     Precautions / Restrictions Precautions Precautions: Fall Precaution Comments: monitor O2 sats Restrictions Weight Bearing Restrictions: No      Mobility  Bed Mobility Overal bed mobility: Needs Assistance Bed Mobility: Supine to Sit     Supine to sit: Supervision     General bed mobility comments: pt was motivated to get OOB    Transfers Overall transfer level: Needs assistance Equipment used: Rolling walker (2 wheels) Transfers: Sit to/from Stand Sit to Stand: Mod assist           General transfer comment: mod to steady but less help with RW    Ambulation/Gait Ambulation/Gait assistance: Min guard Gait Distance (Feet): 40 Feet Assistive device: Rolling walker (2 wheels), 1 person hand held assist Gait Pattern/deviations: Step-through pattern, Step-to pattern, Decreased stride length Gait velocity: reduced Gait velocity interpretation: <1.31 ft/sec, indicative of household ambulator   General Gait Details: pt walked on room air to check sats and has repetitive drops down to 88% but then down to 87% on room air at rest  Stairs            Wheelchair Mobility    Modified Rankin (Stroke Patients Only)       Balance Overall balance assessment: Needs assistance Sitting-balance support: Feet supported Sitting balance-Leahy Scale: Fair     Standing balance support: Bilateral upper extremity supported, During functional activity Standing balance-Leahy Scale: Poor Standing balance comment: walker is essential to maintain balance  Pertinent Vitals/Pain Pain Assessment Pain Assessment: No/denies pain    Home Living Family/patient expects to be discharged to:: Skilled nursing facility Living Arrangements: Alone Available Help at Discharge: Family;Friend(s);Available PRN/intermittently Type of  Home: House Home Access: Stairs to enter Entrance Stairs-Rails: Left;Right;Can reach both Entrance Stairs-Number of Steps: 3 Alternate Level Stairs-Number of Steps: flight Home Layout: Two level;Bed/bath upstairs Home Equipment: Rolling Walker (2 wheels);Cane - single point;Shower seat;Grab bars - tub/shower      Prior Function Prior Level of Function : Independent/Modified Independent             Mobility Comments: pt drove and did own errands ADLs Comments: I for all self care     Hand Dominance   Dominant Hand: Right    Extremity/Trunk Assessment   Upper Extremity Assessment Upper Extremity Assessment: Overall WFL for tasks assessed    Lower Extremity Assessment Lower Extremity Assessment: Generalized weakness    Cervical / Trunk Assessment Cervical / Trunk Assessment: Kyphotic  Communication   Communication: HOH (has augmented hearing devices but not at hosp)  Cognition Arousal/Alertness: Awake/alert Behavior During Therapy: WFL for tasks assessed/performed Overall Cognitive Status: Within Functional Limits for tasks assessed                                 General Comments: hearing hinders information from pt        General Comments General comments (skin integrity, edema, etc.): Pt had wet underwear from her purwick, and changed in standing.  Has a mildly impulsive appearance at that point but may be because she is accustomed to being up alone    Exercises     Assessment/Plan    PT Assessment Patient needs continued PT services  PT Problem List Decreased strength;Decreased balance;Decreased coordination;Decreased safety awareness;Cardiopulmonary status limiting activity       PT Treatment Interventions DME instruction;Gait training;Stair training;Functional mobility training;Therapeutic activities;Therapeutic exercise;Balance training;Neuromuscular re-education;Patient/family education    PT Goals (Current goals can be found in the  Care Plan section)  Acute Rehab PT Goals Patient Stated Goal: to get home and feel better PT Goal Formulation: With patient Time For Goal Achievement: 03/01/23 Potential to Achieve Goals: Good    Frequency Min 1X/week     Co-evaluation               AM-PAC PT "6 Clicks" Mobility  Outcome Measure Help needed turning from your back to your side while in a flat bed without using bedrails?: None Help needed moving from lying on your back to sitting on the side of a flat bed without using bedrails?: A Little Help needed moving to and from a bed to a chair (including a wheelchair)?: A Little Help needed standing up from a chair using your arms (e.g., wheelchair or bedside chair)?: A Lot Help needed to walk in hospital room?: A Lot Help needed climbing 3-5 steps with a railing? : Total 6 Click Score: 15    End of Session Equipment Utilized During Treatment: Oxygen Activity Tolerance: Patient limited by fatigue;Treatment limited secondary to medical complications (Comment) Patient left: in chair;with call bell/phone within reach;with family/visitor present Nurse Communication: Mobility status PT Visit Diagnosis: Unsteadiness on feet (R26.81);Muscle weakness (generalized) (M62.81);Difficulty in walking, not elsewhere classified (R26.2);Dizziness and giddiness (R42)    Time: [id number] PT Time Calculation (min) (ACUTE ONLY): 47 min   Charges:   PT Evaluation $PT Eval Moderate Complexity: 1 Mod PT Treatments $Gait Training: 8-22  mins $Therapeutic Activity: 8-22 mins       Ramond Dial 02/15/2023, 12:31 PM  Mee Hives, PT PhD Acute Rehab Dept. Number: [place] [facility] [place]

## 2023-02-15 NOTE — TOC Progression Note (Signed)
Transition of Care [facility]) - Progression Note    Patient Details  Name: DUNIA NORGREN MRN: [id number] Date of Birth: [birth year]  Transition of Care [facility]) CM/SW Gutierrez, LCSW Phone Number: 02/15/2023, 2:53 PM  Clinical Narrative:    CSW met with pt at bedside to discuss recommendation for STR at SNF. Pt explained how she didn't feel she needed to go to SNF and would be just fine home. Pt reported she has a lot of support back at home. CSW explained that this would only be about 1-3 weeks. Pt refused SNF and stated she'd rather home and her grandsons would be at her home this weekend.  CSW discussed [place] services and pt is open to receiving HH. CSW informed RN CM that pt is interested in [facility]. TOC will continue to follow this admission.         Expected Discharge Plan and Services                                               Social Determinants of Health (SDOH) Interventions SDOH Screenings   Tobacco Use: Unknown (02/14/2023)    Readmission Risk Interventions     No data to display         Beckey Rutter, MSW, LCSWA, LCASA Transitions of Care  Clinical Social Worker I

## 2023-02-15 NOTE — Progress Notes (Signed)
PROGRESS NOTE    Christina Green  [id number] DOB: 1927/02/10 DOA: 02/14/2023 PCP: Lavone Orn, MD    Brief Narrative:  89 year old with history of chronic left bundle branch block, hypertension, hyperlipidemia presented with shortness of breath for about 1 week progressive and severe at rest on the day of arrival.  In the emergency room initially required BiPAP because of tachypnea.  Chest x-ray with cardiomegaly and interstitial pulmonary edema.  Bilateral pleural effusion.  BNP thousand 73.  Patient was treated with BiPAP, IV Lasix and admitted to the hospital.   Assessment & Plan:   Acute respiratory distress and acute hypoxemic respiratory failure secondary to new onset congestive heart failure, type unknown. Presented with tachypnea, respiratory distress and hypoxemia requiring BiPAP.  Responded to BiPAP.  Lasix 40 mg IV twice daily, intake output monitoring, renal functions and are fairly stable.  Replace potassium aggressively on diuresis. Recorded urine output 1300 mL since first dose of Lasix last night. 2D echocardiogram pending.  If significant abnormality, we will talk to cardiology. Patient is on losartan for hypertension, continued. Will benefit with beta-blockers once acute CHF symptoms improved. Patient was able to come off BiPAP today morning.  Keep on nasal cannula oxygen to keep saturation more than 90%.  BiPAP as needed for respiratory distress.  Hypokalemia: Severe in the context of ongoing use of diuretics.  Replace aggressively.  Monitor levels.  Magnesium is adequate.  Elevated troponin: Likely secondary to demand ischemia.  Has chronic left bundle branch block.  Will treat conservatively.  Hyperglycemia: Likely stress related.  On sliding scale insulin.  A1c pending.   DVT prophylaxis: enoxaparin (LOVENOX) injection 40 mg Start: 02/14/23 2000   Code Status: DNR as documented by admitting doctor Family Communication: None.  Called patient's daughter.   Unable.  Will try again later. Disposition Plan: Status is: Inpatient Remains inpatient appropriate because: Significant respiratory distress, needed BiPAP.     Consultants:  None  Procedures:  None  Antimicrobials:  None   Subjective: Patient was seen and examined.  She remained on BiPAP overnight.  I removed her BiPAP as she was looking comfortable.  She could keep up conversation for 10 minutes without being distressed or hypoxemic.  Currently denies any complaints. patient tells me yesterday she could not catch her breath.  She thinks she has gradually worsening issues with recurrent pneumonia since COVID time. Denies any orthopnea, PND or leg swelling.  Objective: Vitals:   02/15/23 0259 02/15/23 0323 02/15/23 0404 02/15/23 0714  BP:  (!) 149/79  (!) 145/86  Pulse:  77  75  Resp:  20  (!) 21  Temp:  (!) 97.5 F (36.4 C)  97.6 F (36.4 C)  TempSrc:  Axillary  Oral  SpO2: 93% 97%  99%  Weight:   58.3 kg   Height:        Intake/Output Summary (Last 24 hours) at 02/15/2023 0758 Last data filed at 02/15/2023 0324 Gross per 24 hour  Intake 253 ml  Output 1300 ml  Net -1047 ml   Filed Weights   02/14/23 2102 02/15/23 0029 02/15/23 0404  Weight: 62.6 kg 58.3 kg 58.3 kg    Examination:  General exam: Appears calm and comfortable  Able to talk in full sentences after removing BiPAP. Respiratory system: Bibasilar crackles present.  At this time respiratory effort normal. Cardiovascular system: S1 & S2 heard, RRR. No JVD, murmurs, rubs, gallops or clicks. No pedal edema. Gastrointestinal system: Abdomen is nondistended, soft and nontender. No organomegaly  or masses felt. Normal bowel sounds heard. Central nervous system: Alert and oriented. No focal neurological deficits. Extremities: Symmetric 5 x 5 power. Skin: No rashes, lesions or ulcers Psychiatry: Judgement and insight appear normal. Mood & affect appropriate.     Data Reviewed: I have personally reviewed  following labs and imaging studies  CBC: Recent Labs  Lab 02/14/23 1645 02/14/23 1730 02/15/23 0024  WBC 11.0*  --  10.1  HGB 13.3 12.9 13.2  HCT 39.2 38.0 37.4  MCV 94.0  --  89.5  PLT 366  --  0000000   Basic Metabolic Panel: Recent Labs  Lab 02/14/23 1645 02/14/23 1730 02/15/23 0024  NA 134* 133* 133*  K 3.7 3.5 2.8*  CL 95*  --  95*  CO2 24  --  27  GLUCOSE 207*  --  180*  BUN 11  --  12  CREATININE 0.88  --  0.73  CALCIUM 9.1  --  8.5*  MG  --   --  2.3   GFR: Estimated Creatinine Clearance: 33.4 mL/min (by C-G formula based on SCr of 0.73 mg/dL). Liver Function Tests: Recent Labs  Lab 02/14/23 1645  AST 36  ALT 26  ALKPHOS 83  BILITOT 1.0  PROT 7.0  ALBUMIN 3.8   No results for input(s): "LIPASE", "AMYLASE" in the last 168 hours. No results for input(s): "AMMONIA" in the last 168 hours. Coagulation Profile: No results for input(s): "INR", "PROTIME" in the last 168 hours. Cardiac Enzymes: No results for input(s): "CKTOTAL", "CKMB", "CKMBINDEX", "TROPONINI" in the last 168 hours. BNP (last 3 results) No results for input(s): "PROBNP" in the last 8760 hours. HbA1C: No results for input(s): "HGBA1C" in the last 72 hours. CBG: Recent Labs  Lab 02/14/23 2148 02/15/23 0552  GLUCAP 262* 163*   Lipid Profile: No results for input(s): "CHOL", "HDL", "LDLCALC", "TRIG", "CHOLHDL", "LDLDIRECT" in the last 72 hours. Thyroid Function Tests: No results for input(s): "TSH", "T4TOTAL", "FREET4", "T3FREE", "THYROIDAB" in the last 72 hours. Anemia Panel: No results for input(s): "VITAMINB12", "FOLATE", "FERRITIN", "TIBC", "IRON", "RETICCTPCT" in the last 72 hours. Sepsis Labs: No results for input(s): "PROCALCITON", "LATICACIDVEN" in the last 168 hours.  Recent Results (from the past 240 hour(s))  Resp panel by RT-PCR (RSV, Flu A&B, Covid) Anterior Nasal Swab     Status: None   Collection Time: 02/14/23  4:48 PM   Specimen: Anterior Nasal Swab  Result Value Ref  Range Status   SARS Coronavirus 2 by RT PCR NEGATIVE NEGATIVE Final   Influenza A by PCR NEGATIVE NEGATIVE Final   Influenza B by PCR NEGATIVE NEGATIVE Final    Comment: (NOTE) The Xpert Xpress SARS-CoV-2/FLU/RSV plus assay is intended as an aid in the diagnosis of influenza from Nasopharyngeal swab specimens and should not be used as a sole basis for treatment. Nasal washings and aspirates are unacceptable for Xpert Xpress SARS-CoV-2/FLU/RSV testing.  Fact Sheet for Patients: EntrepreneurPulse.com.au  Fact Sheet for Healthcare Providers: IncredibleEmployment.be  This test is not yet approved or cleared by the [place] FDA and has been authorized for detection and/or diagnosis of SARS-CoV-2 by FDA under an Emergency Use Authorization (EUA). This EUA will remain in effect (meaning this test can be used) for the duration of the COVID-19 declaration under Section 564(b)(1) of the Act, 21 U.S.C. section 360bbb-3(b)(1), unless the authorization is terminated or revoked.     Resp Syncytial Virus by PCR NEGATIVE NEGATIVE Final    Comment: (NOTE) Fact Sheet for Patients: EntrepreneurPulse.com.au  Fact Sheet for Healthcare Providers: IncredibleEmployment.be  This test is not yet approved or cleared by the [place] FDA and has been authorized for detection and/or diagnosis of SARS-CoV-2 by FDA under an Emergency Use Authorization (EUA). This EUA will remain in effect (meaning this test can be used) for the duration of the COVID-19 declaration under Section 564(b)(1) of the Act, 21 U.S.C. section 360bbb-3(b)(1), unless the authorization is terminated or revoked.  Performed at [facility], [place] [address], [place], [place] [address]          Radiology Studies: DG Chest [place] 1 View  Result Date: 02/14/2023 CLINICAL DATA:  Shortness of breath EXAM: PORTABLE CHEST 1 VIEW COMPARISON:  Previous  studies including the examination of 11/28/2022 FINDINGS: Transverse diameter of heart is increased. Central pulmonary vessels are more prominent. There is interval increase in interstitial markings in both lungs, especially in parahilar regions and lower lung fields. There is no focal pulmonary consolidation. There is blunting of both lateral CP angles. There is no pneumothorax. IMPRESSION: Cardiomegaly. CHF with interstitial pulmonary edema. Small bilateral pleural effusions. Electronically Signed   By: Elmer Picker M.D.   On: 02/14/2023 17:15        Scheduled Meds:  DULoxetine  60 mg Oral Daily   enoxaparin (LOVENOX) injection  40 mg Subcutaneous Q24H   furosemide  40 mg Intravenous Q12H   insulin aspart  0-5 Units Subcutaneous QHS   insulin aspart  0-6 Units Subcutaneous TID WC   labetalol  200 mg Oral BID   losartan  100 mg Oral Daily   sodium chloride flush  3 mL Intravenous Q12H   Continuous Infusions:   LOS: 1 day    Time spent: 35 minutes    Barb Merino, MD [facility] Pager [phone]

## 2023-02-15 NOTE — TOC Progression Note (Signed)
Transition of Care [facility]) - Progression Note    Patient Details  Name: ELDER WILGER MRN: [id number] Date of Birth: [birth year]  [facility]) CM/SW Contact  Zenon Mayo, RN Phone Number: 02/15/2023, 4:07 PM  Clinical Narrative:    NCM spoke with patient at bedside, she states she will have care at home until her daughter gets back on Sunday.  Her neighbor will transport her home at dc.  She has a walker, can and shower chair at home.  NCM offered choice, she has no prefernce. NCM made referral to [place] with [facility].  He is able to take referra, soc will begin 24 to 48 hrs post dc.     Expected Discharge Plan: [place] Barriers to Discharge: Continued Medical Work up  Expected Discharge Plan and Services   Discharge Planning Services: CM Consult Post Acute Care Choice: [place] arrangements for the past 2 months: Single Family Home                 DME Arranged: N/A         HH Arranged: RN, Disease Management, PT, OT HH Agency: [facility] Date [place]: 02/15/23 Time [place]: [place] Representative spoke with at [place]: [place] Determinants of Health ([place]) Interventions SDOH Screenings   Tobacco Use: Unknown (02/14/2023)    Readmission Risk Interventions     No data to display

## 2023-02-15 NOTE — Progress Notes (Signed)
Echocardiogram 2D Echocardiogram has been performed.  Doria Fern Renold Don 02/15/2023, 10:58 AM

## 2023-02-15 NOTE — Progress Notes (Signed)
Assumed pt care at 0700. Aox4, tele monitored, no complaints of CP/SOB. Removed bipap, weaning to room air this shift. Echo done at bedside. Full assessment per flowsheets, Meds given per [facility]. Safety maintained, call bell in reach, all needs made know.

## 2023-02-16 DIAGNOSIS — I5041 Acute combined systolic (congestive) and diastolic (congestive) heart failure: Secondary | ICD-10-CM | POA: Diagnosis not present

## 2023-02-16 DIAGNOSIS — J9601 Acute respiratory failure with hypoxia: Secondary | ICD-10-CM | POA: Diagnosis not present

## 2023-02-16 DIAGNOSIS — R7989 Other specified abnormal findings of blood chemistry: Secondary | ICD-10-CM | POA: Diagnosis not present

## 2023-02-16 DIAGNOSIS — I10 Essential (primary) hypertension: Secondary | ICD-10-CM | POA: Diagnosis not present

## 2023-02-16 LAB — BASIC METABOLIC PANEL
Anion gap: 8 (ref 5–15)
BUN: 25 mg/dL — ABNORMAL HIGH (ref 8–23)
CO2: 27 mmol/L (ref 22–32)
Calcium: 8.6 mg/dL — ABNORMAL LOW (ref 8.9–10.3)
Chloride: 101 mmol/L (ref 98–111)
Creatinine, Ser: 1.05 mg/dL — ABNORMAL HIGH (ref 0.44–1.00)
GFR, Estimated: 49 mL/min — ABNORMAL LOW (ref >60)
Glucose, Bld: 148 mg/dL — ABNORMAL HIGH (ref 70–99)
Potassium: 4.1 mmol/L (ref 3.5–5.1)
Sodium: 136 mmol/L (ref 135–145)

## 2023-02-16 LAB — GLUCOSE, CAPILLARY
Glucose-Capillary: 149 mg/dL — ABNORMAL HIGH (ref 70–99)
Glucose-Capillary: 137 mg/dL — ABNORMAL HIGH (ref 70–99)
Glucose-Capillary: 153 mg/dL — ABNORMAL HIGH (ref 70–99)
Glucose-Capillary: 195 mg/dL — ABNORMAL HIGH (ref 70–99)

## 2023-02-16 LAB — CBC
HCT: 35.5 % — ABNORMAL LOW (ref 36.0–46.0)
Hemoglobin: 11.8 g/dL — ABNORMAL LOW (ref 12.0–15.0)
MCH: 31.3 pg (ref 26.0–34.0)
MCHC: 33.2 g/dL (ref 30.0–36.0)
MCV: 94.2 fL (ref 80.0–100.0)
Platelets: 288 10*3/uL (ref 150–400)
RBC: 3.77 MIL/uL — ABNORMAL LOW (ref 3.87–5.11)
RDW: 13.2 % (ref 11.5–15.5)
WBC: 13.4 10*3/uL — ABNORMAL HIGH (ref 4.0–10.5)
nRBC: 0 % (ref 0.0–0.2)

## 2023-02-16 MED ORDER — METOPROLOL SUCCINATE ER 50 MG PO TB24
50.0000 mg | ORAL_TABLET | Freq: Every day | ORAL | Status: DC
  Administered 2023-02-16 – 2023-02-19 (×4): 50 mg via ORAL
  Filled 2023-02-16 (×4): qty 1

## 2023-02-16 NOTE — Care Management Important Message (Signed)
Important Message  Patient Details  Name: Christina Green MRN: [id number] Date of Birth: Mar 12, 1927   Medicare Important Message Given:  Yes     Orbie Pyo 02/16/2023, 12:22 PM

## 2023-02-16 NOTE — Progress Notes (Signed)
Pt did not want to wear bipap on at this time

## 2023-02-16 NOTE — Progress Notes (Signed)
PROGRESS NOTE    Christina Green  [id number] DOB: Feb 20, 1927 DOA: 02/14/2023 PCP: Lavone Orn, MD    Brief Narrative:  Very highly functional 89 year old with history of chronic left bundle branch block, hypertension, hyperlipidemia presented with shortness of breath for about 1 week progressive and severe at rest on the day of arrival.  In the emergency room initially required BiPAP because of tachypnea.  Chest x-ray with cardiomegaly and interstitial pulmonary edema.  Bilateral pleural effusion.  BNP thousand 73.  Patient was treated with BiPAP, IV Lasix and admitted to the hospital. Patient was found to have severely depressed ejection fraction on echocardiogram.  Cardiology consulted.   Assessment & Plan:   Acute respiratory distress and acute hypoxemic respiratory failure secondary to new onset congestive heart failure, severe systolic. Presented with tachypnea, respiratory distress and hypoxemia requiring BiPAP.  Responded to BiPAP.  Diuresed with Lasix with clinical improvement.  Adequate output.  Renal functions stable, creatinine slightly up today. Weaned off from BiPAP to room air.  Mobilized without hypoxemia. Echocardiogram with ejection fraction of 30 to 35%, new diagnosis. Followed by cardiology.  Titrating GDMT.  Not anticipating cardiac cath. Started on metoprolol and Entresto.  Acute kidney injury: Creatinine 0.7-1.05.  Probably due to [facility] and multiple GDMT.  May benefit with breaking diuretics.  Will defer to cardiology.  Hypokalemia: Severe in the context of ongoing use of diuretics.  Improved.  Keep on his schedule.  Elevated troponin: Likely secondary to demand ischemia.  Has chronic left bundle branch block.  Will treat conservatively.  Hyperglycemia: Likely stress related.  On sliding scale insulin.  A1c 6.8.  Diet modification.  No indication to treat.  Clinically improved.  Remains on IV diuresis.  Lives alone.  Very high functional.  PT OT  recommended SNF, she even does not want home health PT OT.  Anticipate home next 24 to 48 hours, she will have support from her daughter and son-in-law.   DVT prophylaxis: enoxaparin (LOVENOX) injection 40 mg Start: 02/14/23 2000   Code Status: DNR  Family Communication: None.   Disposition Plan: Status is: Inpatient Remains inpatient appropriate because: IV diuresis.   Consultants:  Cardiology  Procedures:  None  Antimicrobials:  None   Subjective:  Patient seen and examined.  Pleasant interactive.  Denies any chest pain or shortness of breath.  She walked in the hallway with a walker.  She thinks he can navigate her stairs.  She does not think that she will need physical therapist come home and work with her.  She would like to go home when we discharge her.  Patient is quite independent.  She tells me she even mows her lawn.  Only help she needs at home and is cleaning people.   Objective: Vitals:   02/16/23 0730 02/16/23 0835 02/16/23 0846 02/16/23 1055  BP: (!) 143/69   127/66  Pulse: 68 72  65  Resp: 18 16  20   Temp: (!) 97.5 F (36.4 C)   98 F (36.7 C)  TempSrc: Oral   Oral  SpO2: 94% 93% 96% 92%  Weight:      Height:        Intake/Output Summary (Last 24 hours) at 02/16/2023 1138 Last data filed at 02/16/2023 1057 Gross per 24 hour  Intake 957 ml  Output 1200 ml  Net -243 ml    Filed Weights   02/15/23 0029 02/15/23 0404 02/16/23 0500  Weight: 58.3 kg 58.3 kg 60.2 kg    Examination:  General exam: Appears calm and comfortable , pleasant and interactive. Respiratory system: On room air.  No added sounds. Cardiovascular system: S1 & S2 heard, RRR. No JVD, murmurs, rubs, gallops or clicks. No pedal edema. Gastrointestinal system: Abdomen is nondistended, soft and nontender. No organomegaly or masses felt. Normal bowel sounds heard. Central nervous system: Alert and oriented. No focal neurological deficits. Extremities: Symmetric 5 x 5 power. Skin:  No rashes, lesions or ulcers Psychiatry: Judgement and insight appear normal. Mood & affect appropriate.     Data Reviewed: I have personally reviewed following labs and imaging studies  CBC: Recent Labs  Lab 02/14/23 1645 02/14/23 1730 02/15/23 0024 02/16/23 0033  WBC 11.0*  --  10.1 13.4*  HGB 13.3 12.9 13.2 11.8*  HCT 39.2 38.0 37.4 35.5*  MCV 94.0  --  89.5 94.2  PLT 366  --  296 [id number]    Basic Metabolic Panel: Recent Labs  Lab 02/14/23 1645 02/14/23 1730 02/15/23 0024 02/16/23 0033  NA 134* 133* 133* 136  K 3.7 3.5 2.8* 4.1  CL 95*  --  95* 101  CO2 24  --  27 27  GLUCOSE 207*  --  180* 148*  BUN 11  --  12 25*  CREATININE 0.88  --  0.73 1.05*  CALCIUM 9.1  --  8.5* 8.6*  MG  --   --  2.3  --     GFR: Estimated Creatinine Clearance: 25.9 mL/min (A) (by C-G formula based on SCr of 1.05 mg/dL (H)). Liver Function Tests: Recent Labs  Lab 02/14/23 1645  AST 36  ALT 26  ALKPHOS 83  BILITOT 1.0  PROT 7.0  ALBUMIN 3.8    No results for input(s): "LIPASE", "AMYLASE" in the last 168 hours. No results for input(s): "AMMONIA" in the last 168 hours. Coagulation Profile: No results for input(s): "INR", "PROTIME" in the last 168 hours. Cardiac Enzymes: No results for input(s): "CKTOTAL", "CKMB", "CKMBINDEX", "TROPONINI" in the last 168 hours. BNP (last 3 results) No results for input(s): "PROBNP" in the last 8760 hours. HbA1C: Recent Labs    02/15/23 0024  HGBA1C 6.8*   CBG: Recent Labs  Lab 02/15/23 1131 02/15/23 1639 02/15/23 2112 02/16/23 0551 02/16/23 1052  GLUCAP 131* 131* 175* 149* 137*    Lipid Profile: No results for input(s): "CHOL", "HDL", "LDLCALC", "TRIG", "CHOLHDL", "LDLDIRECT" in the last 72 hours. Thyroid Function Tests: No results for input(s): "TSH", "T4TOTAL", "FREET4", "T3FREE", "THYROIDAB" in the last 72 hours. Anemia Panel: No results for input(s): "VITAMINB12", "FOLATE", "FERRITIN", "TIBC", "IRON", "RETICCTPCT" in the last  72 hours. Sepsis Labs: No results for input(s): "PROCALCITON", "LATICACIDVEN" in the last 168 hours.  Recent Results (from the past 240 hour(s))  Resp panel by RT-PCR (RSV, Flu A&B, Covid) Anterior Nasal Swab     Status: None   Collection Time: 02/14/23  4:48 PM   Specimen: Anterior Nasal Swab  Result Value Ref Range Status   SARS Coronavirus 2 by RT PCR NEGATIVE NEGATIVE Final   Influenza A by PCR NEGATIVE NEGATIVE Final   Influenza B by PCR NEGATIVE NEGATIVE Final    Comment: (NOTE) The Xpert Xpress SARS-CoV-2/FLU/RSV plus assay is intended as an aid in the diagnosis of influenza from Nasopharyngeal swab specimens and should not be used as a sole basis for treatment. Nasal washings and aspirates are unacceptable for Xpert Xpress SARS-CoV-2/FLU/RSV testing.  Fact Sheet for Patients: EntrepreneurPulse.com.au  Fact Sheet for Healthcare Providers: IncredibleEmployment.be  This test is not yet approved or  cleared by the [place] and has been authorized for detection and/or diagnosis of SARS-CoV-2 by FDA under an Emergency Use Authorization (EUA). This EUA will remain in effect (meaning this test can be used) for the duration of the COVID-19 declaration under Section 564(b)(1) of the Act, 21 U.S.C. section 360bbb-3(b)(1), unless the authorization is terminated or revoked.     Resp Syncytial Virus by PCR NEGATIVE NEGATIVE Final    Comment: (NOTE) Fact Sheet for Patients: EntrepreneurPulse.com.au  Fact Sheet for Healthcare Providers: IncredibleEmployment.be  This test is not yet approved or cleared by the [place] FDA and has been authorized for detection and/or diagnosis of SARS-CoV-2 by FDA under an Emergency Use Authorization (EUA). This EUA will remain in effect (meaning this test can be used) for the duration of the COVID-19 declaration under Section 564(b)(1) of the Act, 21  U.S.C. section 360bbb-3(b)(1), unless the authorization is terminated or revoked.  Performed at [facility], [place] [address], [place], [place] [address]   Blood culture (routine x 2)     Status: None (Preliminary result)   Collection Time: 02/14/23  5:00 PM   Specimen: BLOOD  Result Value Ref Range Status   Specimen Description BLOOD SITE NOT SPECIFIED  Final   Special Requests   Final    BOTTLES DRAWN AEROBIC AND ANAEROBIC Blood Culture results may not be optimal due to an inadequate volume of blood received in culture bottles   Culture   Final    NO GROWTH 2 DAYS Performed at [facility], [place] [address], [place],  [address]    Report Status PENDING  Incomplete  Blood culture (routine x 2)     Status: None (Preliminary result)   Collection Time: 02/14/23  5:17 PM   Specimen: BLOOD  Result Value Ref Range Status   Specimen Description BLOOD RIGHT ANTECUBITAL  Final   Special Requests   Final    BOTTLES DRAWN AEROBIC AND ANAEROBIC Blood Culture results may not be optimal due to an excessive volume of blood received in culture bottles   Culture   Final    NO GROWTH 2 DAYS Performed at [facility], [place] [address], [place],  [address]    Report Status PENDING  Incomplete         Radiology Studies: ECHOCARDIOGRAM COMPLETE  Result Date: 02/15/2023    ECHOCARDIOGRAM REPORT   Patient Name:   XANDRIA LECHTENBERG Bickhart Date of Exam: 02/15/2023 Medical Rec #:  [id number]        Height:       59.0 in Accession #:    [id number]       Weight:       128.5 lb Date of Birth:  16-Sep-1927         BSA:          1.528 m Patient Age:    75 years         BP:           145/86 mmHg Patient Gender: F                HR:           71 bpm. Exam Location:  Inpatient Procedure: 2D Echo, Intracardiac Opacification Agent, Cardiac Doppler and Color            Doppler Indications:    CHF  History:        Patient has no prior history of Echocardiogram examinations.  CHF; Risk  Factors:Hypertension.  Sonographer:    Marella Chimes Referring Phys: [id number] [place]  1. Left ventricular ejection fraction, by estimation, is 30 to 35%. The left ventricle has moderately decreased function. The left ventricle demonstrates regional wall motion abnormalities (see scoring diagram/findings for description). There is mild concentric left ventricular hypertrophy. Left ventricular diastolic parameters are consistent with Grade I diastolic dysfunction (impaired relaxation). Elevated left atrial pressure.  2. Right ventricular systolic function is normal. The right ventricular size is normal.  3. Left atrial size was mildly dilated.  4. The mitral valve is normal in structure. Trivial mitral valve regurgitation. No evidence of mitral stenosis.  5. The aortic valve is normal in structure. Aortic valve regurgitation is mild. Aortic valve sclerosis is present, with no evidence of aortic valve stenosis.  6. There is mild dilatation of the ascending aorta, measuring 38 mm.  7. The inferior vena cava is normal in size with greater than 50% respiratory variability, suggesting right atrial pressure of 3 mmHg. FINDINGS  Left Ventricle: Left ventricular ejection fraction, by estimation, is 30 to 35%. The left ventricle has moderately decreased function. The left ventricle demonstrates regional wall motion abnormalities. The left ventricular internal cavity size was normal in size. There is mild concentric left ventricular hypertrophy. Left ventricular diastolic parameters are consistent with Grade I diastolic dysfunction (impaired relaxation). Elevated left atrial pressure.  LV Wall Scoring: The inferior septum, entire inferior wall, and apex are akinetic. The entire anterior wall, entire lateral wall, and anterior septum are hypokinetic. Right Ventricle: The right ventricular size is normal. No increase in right ventricular wall thickness. Right ventricular systolic function is normal. Left Atrium:  Left atrial size was mildly dilated. Right Atrium: Right atrial size was normal in size. Pericardium: There is no evidence of pericardial effusion. Presence of epicardial fat layer. Mitral Valve: The mitral valve is normal in structure. Trivial mitral valve regurgitation. No evidence of mitral valve stenosis. Tricuspid Valve: The tricuspid valve is normal in structure. Tricuspid valve regurgitation is not demonstrated. No evidence of tricuspid stenosis. Aortic Valve: The aortic valve is normal in structure. Aortic valve regurgitation is mild. Aortic valve sclerosis is present, with no evidence of aortic valve stenosis. Aortic valve mean gradient measures 5.0 mmHg. Aortic valve peak gradient measures 8.5  mmHg. Pulmonic Valve: The pulmonic valve was normal in structure. Pulmonic valve regurgitation is not visualized. No evidence of pulmonic stenosis. Aorta: There is mild dilatation of the ascending aorta, measuring 38 mm. Venous: The inferior vena cava is normal in size with greater than 50% respiratory variability, suggesting right atrial pressure of 3 mmHg. IAS/Shunts: No atrial level shunt detected by color flow Doppler.  LEFT VENTRICLE PLAX 2D LVIDd:         4.60 cm      Diastology LVIDs:         3.90 cm      LV e' medial:    2.28 cm/s LV PW:         1.30 cm      LV E/e' medial:  38.2 LV IVS:        1.30 cm      LV e' lateral:   4.13 cm/s                             LV E/e' lateral: 21.1  LV Volumes (MOD) LV vol d, MOD A4C: 101.0 ml LV vol s, MOD A4C:  66.3 ml LV SV MOD A4C:     101.0 ml RIGHT VENTRICLE RV S prime:     10.20 cm/s TAPSE (M-mode): 1.4 cm LEFT ATRIUM             Index        RIGHT ATRIUM           Index LA diam:        3.80 cm 2.49 cm/m   RA Area:     10.80 cm LA Vol (A2C):   67.9 ml 44.43 ml/m  RA Volume:   22.40 ml  14.66 ml/m LA Vol (A4C):   44.3 ml 28.98 ml/m LA Biplane Vol: 57.6 ml 37.69 ml/m  AORTIC VALVE AV Vmax:           146.00 cm/s AV Vmean:          100.000 cm/s AV VTI:             0.317 m AV Peak Grad:      8.5 mmHg AV Mean Grad:      5.0 mmHg LVOT Vmax:         83.00 cm/s LVOT Vmean:        48.200 cm/s LVOT VTI:          0.169 m LVOT/AV VTI ratio: 0.53  AORTA Ao Root diam: 2.80 cm Ao Asc diam:  3.80 cm MITRAL VALVE MV Area (PHT): 3.99 cm     SHUNTS MV Decel Time: 190 msec     Systemic VTI: 0.17 m MV E velocity: 87.00 cm/s MV A velocity: 104.00 cm/s MV E/A ratio:  0.84 Kardie Tobb DO Electronically signed by Berniece Salines DO Signature Date/Time: 02/15/2023/11:24:27 AM    Final    DG Chest Port 1 View  Result Date: 02/14/2023 CLINICAL DATA:  Shortness of breath EXAM: PORTABLE CHEST 1 VIEW COMPARISON:  Previous studies including the examination of 11/28/2022 FINDINGS: Transverse diameter of heart is increased. Central pulmonary vessels are more prominent. There is interval increase in interstitial markings in both lungs, especially in parahilar regions and lower lung fields. There is no focal pulmonary consolidation. There is blunting of both lateral CP angles. There is no pneumothorax. IMPRESSION: Cardiomegaly. CHF with interstitial pulmonary edema. Small bilateral pleural effusions. Electronically Signed   By: Elmer Picker M.D.   On: 02/14/2023 17:15        Scheduled Meds:  aspirin EC  81 mg Oral Daily   DULoxetine  60 mg Oral Daily   enoxaparin (LOVENOX) injection  40 mg Subcutaneous Q24H   furosemide  40 mg Intravenous Q12H   insulin aspart  0-5 Units Subcutaneous QHS   insulin aspart  0-6 Units Subcutaneous TID WC   metoprolol succinate  50 mg Oral Daily   sacubitril-valsartan  1 tablet Oral BID   sodium chloride flush  3 mL Intravenous Q12H   Continuous Infusions:   LOS: 2 days    Time spent: 35 minutes    Barb Merino, MD [facility] Pager [phone]

## 2023-02-16 NOTE — Progress Notes (Signed)
Rounding Note    Patient Name: Christina Green Date of Encounter: 02/16/2023  [place] Cardiologist: Buford Dresser, MD   Subjective   No acute events. Was able to ambulate in the hall without significant shortness of breath.  Inpatient Medications    Scheduled Meds:  aspirin EC  81 mg Oral Daily   DULoxetine  60 mg Oral Daily   enoxaparin (LOVENOX) injection  40 mg Subcutaneous Q24H   furosemide  40 mg Intravenous Q12H   insulin aspart  0-5 Units Subcutaneous QHS   insulin aspart  0-6 Units Subcutaneous TID WC   metoprolol succinate  50 mg Oral Daily   sacubitril-valsartan  1 tablet Oral BID   sodium chloride flush  3 mL Intravenous Q12H   Continuous Infusions:  PRN Meds: acetaminophen **OR** acetaminophen, senna-docusate   Vital Signs    Vitals:   02/16/23 0835 02/16/23 0846 02/16/23 1055 02/16/23 1607  BP:   127/66 (!) 140/69  Pulse: 72  65 69  Resp: 16  20 (!) 23  Temp:   98 F (36.7 C) 97.6 F (36.4 C)  TempSrc:   Oral Oral  SpO2: 93% 96% 92% 95%  Weight:      Height:        Intake/Output Summary (Last 24 hours) at 02/16/2023 1629 Last data filed at 02/16/2023 1330 Gross per 24 hour  Intake 1197 ml  Output 1200 ml  Net -3 ml      02/16/2023    5:00 AM 02/15/2023    4:04 AM 02/15/2023   12:29 AM  Last 3 Weights  Weight (lbs) 132 lb 11.5 oz 128 lb 8.5 oz 128 lb 8.5 oz  Weight (kg) 60.2 kg 58.3 kg 58.3 kg      Telemetry    NSR - Personally Reviewed  ECG    No new - Personally Reviewed  Physical Exam   GEN: No acute distress.   Neck: No JVD sitting upright Cardiac: RRR, no murmurs, rubs, or gallops.  Respiratory: Clear to auscultation bilaterally. GI: Soft, nontender, non-distended  MS: No edema; No deformity. Neuro:  Nonfocal  Psych: Normal affect   Labs    High Sensitivity Troponin:   Recent Labs  Lab 02/14/23 1645 02/14/23 1910 02/14/23 2301 02/15/23 0024  TROPONINIHS 32* 90* 165* 358*      Chemistry Recent Labs  Lab 02/14/23 1645 02/14/23 1730 02/15/23 0024 02/16/23 0033  NA 134* 133* 133* 136  K 3.7 3.5 2.8* 4.1  CL 95*  --  95* 101  CO2 24  --  27 27  GLUCOSE 207*  --  180* 148*  BUN 11  --  12 25*  CREATININE 0.88  --  0.73 1.05*  CALCIUM 9.1  --  8.5* 8.6*  MG  --   --  2.3  --   PROT 7.0  --   --   --   ALBUMIN 3.8  --   --   --   AST 36  --   --   --   ALT 26  --   --   --   ALKPHOS 83  --   --   --   BILITOT 1.0  --   --   --   GFRNONAA >60  --  >60 49*  ANIONGAP 15  --  11 8    Lipids No results for input(s): "CHOL", "TRIG", "HDL", "LABVLDL", "LDLCALC", "CHOLHDL" in the last 168 hours.  Hematology Recent Labs  Lab  02/14/23 1645 02/14/23 1730 02/15/23 0024 02/16/23 0033  WBC 11.0*  --  10.1 13.4*  RBC 4.17  --  4.18 3.77*  HGB 13.3 12.9 13.2 11.8*  HCT 39.2 38.0 37.4 35.5*  MCV 94.0  --  89.5 94.2  MCH 31.9  --  31.6 31.3  MCHC 33.9  --  35.3 33.2  RDW 12.8  --  12.7 13.2  PLT 366  --  296 288   Thyroid No results for input(s): "TSH", "FREET4" in the last 168 hours.  BNP Recent Labs  Lab 02/14/23 1645  BNP 1,073.4*    DDimer No results for input(s): "DDIMER" in the last 168 hours.   Radiology    ECHOCARDIOGRAM COMPLETE  Result Date: 02/15/2023    ECHOCARDIOGRAM REPORT   Patient Name:   Christina Green Date of Exam: 02/15/2023 Medical Rec #:  [id number]        Height:       59.0 in Accession #:    [id number]       Weight:       128.5 lb Date of Birth:  January 16, 1927         BSA:          1.528 m Patient Age:    89 years         BP:           145/86 mmHg Patient Gender: F                HR:           71 bpm. Exam Location:  Inpatient Procedure: 2D Echo, Intracardiac Opacification Agent, Cardiac Doppler and Color            Doppler Indications:    CHF  History:        Patient has no prior history of Echocardiogram examinations.                 CHF; Risk Factors:Hypertension.  Sonographer:    Marella Chimes Referring Phys: [id number] [place]  1. Left ventricular ejection fraction, by estimation, is 30 to 35%. The left ventricle has moderately decreased function. The left ventricle demonstrates regional wall motion abnormalities (see scoring diagram/findings for description). There is mild concentric left ventricular hypertrophy. Left ventricular diastolic parameters are consistent with Grade I diastolic dysfunction (impaired relaxation). Elevated left atrial pressure.  2. Right ventricular systolic function is normal. The right ventricular size is normal.  3. Left atrial size was mildly dilated.  4. The mitral valve is normal in structure. Trivial mitral valve regurgitation. No evidence of mitral stenosis.  5. The aortic valve is normal in structure. Aortic valve regurgitation is mild. Aortic valve sclerosis is present, with no evidence of aortic valve stenosis.  6. There is mild dilatation of the ascending aorta, measuring 38 mm.  7. The inferior vena cava is normal in size with greater than 50% respiratory variability, suggesting right atrial pressure of 3 mmHg. FINDINGS  Left Ventricle: Left ventricular ejection fraction, by estimation, is 30 to 35%. The left ventricle has moderately decreased function. The left ventricle demonstrates regional wall motion abnormalities. The left ventricular internal cavity size was normal in size. There is mild concentric left ventricular hypertrophy. Left ventricular diastolic parameters are consistent with Grade I diastolic dysfunction (impaired relaxation). Elevated left atrial pressure.  LV Wall Scoring: The inferior septum, entire inferior wall, and apex are akinetic. The entire anterior wall, entire lateral wall, and anterior septum are hypokinetic.  Right Ventricle: The right ventricular size is normal. No increase in right ventricular wall thickness. Right ventricular systolic function is normal. Left Atrium: Left atrial size was mildly dilated. Right Atrium: Right atrial size was normal in size.  Pericardium: There is no evidence of pericardial effusion. Presence of epicardial fat layer. Mitral Valve: The mitral valve is normal in structure. Trivial mitral valve regurgitation. No evidence of mitral valve stenosis. Tricuspid Valve: The tricuspid valve is normal in structure. Tricuspid valve regurgitation is not demonstrated. No evidence of tricuspid stenosis. Aortic Valve: The aortic valve is normal in structure. Aortic valve regurgitation is mild. Aortic valve sclerosis is present, with no evidence of aortic valve stenosis. Aortic valve mean gradient measures 5.0 mmHg. Aortic valve peak gradient measures 8.5  mmHg. Pulmonic Valve: The pulmonic valve was normal in structure. Pulmonic valve regurgitation is not visualized. No evidence of pulmonic stenosis. Aorta: There is mild dilatation of the ascending aorta, measuring 38 mm. Venous: The inferior vena cava is normal in size with greater than 50% respiratory variability, suggesting right atrial pressure of 3 mmHg. IAS/Shunts: No atrial level shunt detected by color flow Doppler.  LEFT VENTRICLE PLAX 2D LVIDd:         4.60 cm      Diastology LVIDs:         3.90 cm      LV e' medial:    2.28 cm/s LV PW:         1.30 cm      LV E/e' medial:  38.2 LV IVS:        1.30 cm      LV e' lateral:   4.13 cm/s                             LV E/e' lateral: 21.1  LV Volumes (MOD) LV vol d, MOD A4C: 101.0 ml LV vol s, MOD A4C: 66.3 ml LV SV MOD A4C:     101.0 ml RIGHT VENTRICLE RV S prime:     10.20 cm/s TAPSE (M-mode): 1.4 cm LEFT ATRIUM             Index        RIGHT ATRIUM           Index LA diam:        3.80 cm 2.49 cm/m   RA Area:     10.80 cm LA Vol (A2C):   67.9 ml 44.43 ml/m  RA Volume:   22.40 ml  14.66 ml/m LA Vol (A4C):   44.3 ml 28.98 ml/m LA Biplane Vol: 57.6 ml 37.69 ml/m  AORTIC VALVE AV Vmax:           146.00 cm/s AV Vmean:          100.000 cm/s AV VTI:            0.317 m AV Peak Grad:      8.5 mmHg AV Mean Grad:      5.0 mmHg LVOT Vmax:         83.00  cm/s LVOT Vmean:        48.200 cm/s LVOT VTI:          0.169 m LVOT/AV VTI ratio: 0.53  AORTA Ao Root diam: 2.80 cm Ao Asc diam:  3.80 cm MITRAL VALVE MV Area (PHT): 3.99 cm     SHUNTS MV Decel Time: 190 msec     Systemic VTI: 0.17 m MV  E velocity: 87.00 cm/s MV A velocity: 104.00 cm/s MV E/A ratio:  0.84 Kardie Tobb DO Electronically signed by Berniece Salines DO Signature Date/Time: 02/15/2023/11:24:27 AM    Final    DG Chest Port 1 View  Result Date: 02/14/2023 CLINICAL DATA:  Shortness of breath EXAM: PORTABLE CHEST 1 VIEW COMPARISON:  Previous studies including the examination of 11/28/2022 FINDINGS: Transverse diameter of heart is increased. Central pulmonary vessels are more prominent. There is interval increase in interstitial markings in both lungs, especially in parahilar regions and lower lung fields. There is no focal pulmonary consolidation. There is blunting of both lateral CP angles. There is no pneumothorax. IMPRESSION: Cardiomegaly. CHF with interstitial pulmonary edema. Small bilateral pleural effusions. Electronically Signed   By: Elmer Picker M.D.   On: 02/14/2023 17:15    Cardiac Studies   echo 02/14/23  1. Left ventricular ejection fraction, by estimation, is 30 to 35%. The  left ventricle has moderately decreased function. The left ventricle  demonstrates regional wall motion abnormalities (see scoring  diagram/findings for description). There is mild  concentric left ventricular hypertrophy. Left ventricular diastolic  parameters are consistent with Grade I diastolic dysfunction (impaired  relaxation). Elevated left atrial pressure.   2. Right ventricular systolic function is normal. The right ventricular  size is normal.   3. Left atrial size was mildly dilated.   4. The mitral valve is normal in structure. Trivial mitral valve  regurgitation. No evidence of mitral stenosis.   5. The aortic valve is normal in structure. Aortic valve regurgitation is  mild. Aortic valve  sclerosis is present, with no evidence of aortic valve  stenosis.   6. There is mild dilatation of the ascending aorta, measuring 38 mm.   7. The inferior vena cava is normal in size with greater than 50%  respiratory variability, suggesting right atrial pressure of 3 mmHg.   Patient Profile     89 y.o. female with PMH hypertension, LBBB, presenting with shortness of breath, found to have reduced EF consistent with systolic heart failure.   Assessment & Plan    Acute hypoxic respiratory failure Acute systolic heart failure Elevated troponin LBBB -improved with diuresis -will titrate to GDMT. Added entresto, changed labetalol to metoprolol, will add farxiga today -discussed cath vs medical management. She is not opposed to cath, and she is a strong 89 years old, but after shared decision making we will start with medical management and proceed to cath only if medically necessary -unknown timeline, could be due to chronic LBBB. Based on symptoms, workup, could consider CRT in the future. -continue aspirin -have not discussed statin yet. She is advanced age but overall very functional. Would offer her the option.  Hypertension -on losartan-HCTZ prior to admission -changed to entresto this admission -changed labetalol to metoprolol succinate given reduced EF  Type II diabetes -A1c 6.8 -adding farxiga today  For questions or updates, please contact [place] Please consult [email or web address] for contact info under     Signed, Buford Dresser, MD  02/16/2023, 4:29 PM

## 2023-02-16 NOTE — Evaluation (Signed)
Occupational Therapy Evaluation Patient Details Name: Christina Green MRN: [id number] DOB: Jul 06, 1927 Today's Date: 02/16/2023   History of Present Illness 89 yo female with onset of SOB was admitted on 3/20 and found elevated troponin, cardiomegaly, pleural effusion and pulm edema, with CHF dx.  PMHx:  HTN, depression, HLD, L BBB, OA knee, shingles, HOH,   Clinical Impression   Patient admitted for above diagnosis. PTA patient lived alone, her daughter lives close by and can assist prn. Patient also reports being Mod I in bADLs and iADLs including driving, ambulating with no AD in home but a SPC for longer distance. Patient is currently needing min guard assist to complete functional ambulation and transfers, following functional ambulation Pt respiratory rate increased but returned following pursed lip breathing. Educated Pt on CHF signs, symptoms, and precautions and patient verbalized understanding handout provided. Pt completing bed mobility with supervision. Pt would benefit from continued skilled acute OT services to address above deficits and help transition to next level of care. Pt would benefit from skilled OT facility that can provide 24/7 assist at this time but patient displayed the potential and motivation to return home with recommended level of assistance/support.      Recommendations for follow up therapy are one component of a multi-disciplinary discharge planning process, led by the attending physician.  Recommendations may be updated based on patient status, additional functional criteria and insurance authorization.   Follow Up Recommendations  Skilled nursing-short term rehab (<3 hours/day)     Assistance Recommended at Discharge Intermittent Supervision/Assistance  Patient can return home with the following A little help with bathing/dressing/bathroom;Assistance with cooking/housework;Assist for transportation;Help with stairs or ramp for entrance;A little help with walking  and/or transfers    Functional Status Assessment  Patient has had a recent decline in their functional status and demonstrates the ability to make significant improvements in function in a reasonable and predictable amount of time.  Equipment Recommendations  None recommended by OT    Recommendations for Other Services       Precautions / Restrictions Precautions Precautions: Fall Precaution Comments: monitor O2 sats Restrictions Weight Bearing Restrictions: No      Mobility Bed Mobility Overal bed mobility: Needs Assistance Bed Mobility: Supine to Sit     Supine to sit: Supervision     General bed mobility comments: pt was motivated to get OOB    Transfers Overall transfer level: Needs assistance Equipment used: Rolling walker (2 wheels) Transfers: Sit to/from Stand Sit to Stand: Min guard                  Balance Overall balance assessment: Needs assistance Sitting-balance support: Feet supported Sitting balance-Leahy Scale: Fair     Standing balance support: Bilateral upper extremity supported, During functional activity Standing balance-Leahy Scale: Fair Standing balance comment: Patient let go of walker and side stepped to t/f to chair.                           ADL either performed or assessed with clinical judgement   ADL Overall ADL's : Needs assistance/impaired Eating/Feeding: Independent   Grooming: Min guard;Standing   Upper Body Bathing: Supervision/ safety;Sitting   Lower Body Bathing: Min guard;Sitting/lateral leans   Upper Body Dressing : Sitting;Min guard   Lower Body Dressing: Moderate assistance;Sitting/lateral leans Lower Body Dressing Details (indicate cue type and reason): Pt uses AE to dress Lower body at baseline Toilet Transfer: Min guard;Ambulation;Rolling walker (2 wheels)  Toileting- Water quality scientist and Hygiene: Min guard;Sit to/from stand   Tub/ Shower Transfer: Walk-in shower;Min  guard;Ambulation;Rolling walker (2 wheels)   Functional mobility during ADLs: Min guard;Rolling walker (2 wheels)       Vision Baseline Vision/History: 1 Wears glasses Patient Visual Report: No change from baseline Additional Comments: Patient reports needing glasses to see at distance, patient reports MD is starting but has little impact at this time. Vision appointment soon     Perception     Praxis      Pertinent Vitals/Pain Pain Assessment Pain Assessment: No/denies pain     Hand Dominance Right   Extremity/Trunk Assessment Upper Extremity Assessment Upper Extremity Assessment: Overall WFL for tasks assessed   Lower Extremity Assessment Lower Extremity Assessment: Generalized weakness   Cervical / Trunk Assessment Cervical / Trunk Assessment: Kyphotic   Communication Communication Communication: HOH (Pt has augmented hearing aids)   Cognition Arousal/Alertness: Awake/alert Behavior During Therapy: WFL for tasks assessed/performed Overall Cognitive Status: Within Functional Limits for tasks assessed                                 General Comments: A&Ox4, patient able to self identify if she said something incorrectly     General Comments  Pt pleasant and interactive throughout session, Pt RR increased to 33 at end of OT session and patient appeared fatigued. Respiratory rate returned to 22 following pursed lip breathing.    Exercises     Shoulder Instructions      Home Living Family/patient expects to be discharged to:: Skilled nursing facility Living Arrangements: Alone Available Help at Discharge: Family;Friend(s);Available PRN/intermittently Type of Home: House Home Access: Stairs to enter [employer] of Steps: 3 Entrance Stairs-Rails: Left;Right;Can reach both Home Layout: Two level;Bed/bath upstairs Alternate Level Stairs-Number of Steps: 14 total, platform halfway   Bathroom Shower/Tub: Occupational psychologist:  Standard     Home Equipment: Conservation officer, nature (2 wheels);Cane - single point;Shower seat;Grab bars - tub/shower;Shower seat - built Hotel manager: Sock aid        Prior Functioning/Environment Prior Level of Function : Independent/Modified Independent             Mobility Comments: Patient reports she drove PTA and used SPC to ambulate longer distances such as her driveway ADLs Comments: Independent for all bADLs/iADLs.        OT Problem List: Impaired balance (sitting and/or standing);Decreased activity tolerance;Cardiopulmonary status limiting activity      OT Treatment/Interventions: Self-care/ADL training;Energy conservation;Therapeutic activities;Patient/family education;DME and/or AE instruction;Balance training    OT Goals(Current goals can be found in the care plan section) Acute Rehab OT Goals Patient Stated Goal: Go home OT Goal Formulation: With patient Time For Goal Achievement: 03/02/23 Potential to Achieve Goals: Fair  OT Frequency: Min 2X/week    Co-evaluation              AM-PAC OT "6 Clicks" Daily Activity     Outcome Measure Help from another person eating meals?: None Help from another person taking care of personal grooming?: A Little Help from another person toileting, which includes using toliet, bedpan, or urinal?: A Little Help from another person bathing (including washing, rinsing, drying)?: A Little Help from another person to put on and taking off regular upper body clothing?: A Little Help from another person to put on and taking off regular lower body clothing?: A Lot 6 Click Score: 18  End of Session Equipment Utilized During Treatment: Gait belt;Rolling walker (2 wheels) Nurse Communication: Mobility status  Activity Tolerance: Patient tolerated treatment well Patient left: in chair;with call bell/phone within reach  OT Visit Diagnosis: Unsteadiness on feet (R26.81);Other abnormalities of gait and  mobility (R26.89)                Time: [id number] OT Time Calculation (min): 28 min Charges:  OT General Charges $OT Visit: 1 Visit OT Evaluation $OT Eval Moderate Complexity: 1 Mod OT Treatments $Therapeutic Activity: 8-22 mins  02/16/2023  AB, OTR/L  Acute Rehabilitation Services  Office: [phone]   Cori Razor 02/16/2023, 9:03 AM

## 2023-02-17 DIAGNOSIS — I5023 Acute on chronic systolic (congestive) heart failure: Secondary | ICD-10-CM | POA: Diagnosis not present

## 2023-02-17 DIAGNOSIS — E1169 Type 2 diabetes mellitus with other specified complication: Secondary | ICD-10-CM | POA: Diagnosis not present

## 2023-02-17 DIAGNOSIS — E785 Hyperlipidemia, unspecified: Secondary | ICD-10-CM

## 2023-02-17 DIAGNOSIS — N179 Acute kidney failure, unspecified: Secondary | ICD-10-CM

## 2023-02-17 DIAGNOSIS — I10 Essential (primary) hypertension: Secondary | ICD-10-CM | POA: Diagnosis not present

## 2023-02-17 LAB — CBC
HCT: 41.6 % (ref 36.0–46.0)
Hemoglobin: 13.5 g/dL (ref 12.0–15.0)
MCH: 31.3 pg (ref 26.0–34.0)
MCHC: 32.5 g/dL (ref 30.0–36.0)
MCV: 96.5 fL (ref 80.0–100.0)
Platelets: 306 10*3/uL (ref 150–400)
RBC: 4.31 MIL/uL (ref 3.87–5.11)
RDW: 13.1 % (ref 11.5–15.5)
WBC: 10 10*3/uL (ref 4.0–10.5)
nRBC: 0 % (ref 0.0–0.2)

## 2023-02-17 LAB — BASIC METABOLIC PANEL
Anion gap: 16 — ABNORMAL HIGH (ref 5–15)
BUN: 27 mg/dL — ABNORMAL HIGH (ref 8–23)
CO2: 26 mmol/L (ref 22–32)
Calcium: 9.4 mg/dL (ref 8.9–10.3)
Chloride: 95 mmol/L — ABNORMAL LOW (ref 98–111)
Creatinine, Ser: 1.01 mg/dL — ABNORMAL HIGH (ref 0.44–1.00)
GFR, Estimated: 52 mL/min — ABNORMAL LOW (ref >60)
Glucose, Bld: 140 mg/dL — ABNORMAL HIGH (ref 70–99)
Potassium: 3.7 mmol/L (ref 3.5–5.1)
Sodium: 137 mmol/L (ref 135–145)

## 2023-02-17 LAB — GLUCOSE, CAPILLARY
Glucose-Capillary: 169 mg/dL — ABNORMAL HIGH (ref 70–99)
Glucose-Capillary: 210 mg/dL — ABNORMAL HIGH (ref 70–99)
Glucose-Capillary: 128 mg/dL — ABNORMAL HIGH (ref 70–99)
Glucose-Capillary: 138 mg/dL — ABNORMAL HIGH (ref 70–99)

## 2023-02-17 MED ORDER — ENOXAPARIN SODIUM 30 MG/0.3ML IJ SOSY
30.0000 mg | PREFILLED_SYRINGE | INTRAMUSCULAR | Status: DC
  Administered 2023-02-17: 30 mg via SUBCUTANEOUS
  Filled 2023-02-17: qty 0.3

## 2023-02-17 MED ORDER — FUROSEMIDE 40 MG PO TABS
40.0000 mg | ORAL_TABLET | Freq: Every day | ORAL | Status: DC
  Administered 2023-02-18: 40 mg via ORAL
  Filled 2023-02-17: qty 1

## 2023-02-17 NOTE — Progress Notes (Signed)
Progress Note   Patient: Christina Green [id number] DOB: 16-Apr-1927 DOA: 02/14/2023     3 DOS: the patient was seen and examined on 02/17/2023   Brief hospital course: Mrs. Rosene was admitted to the hospital with the working diagnosis of heart failure exacerbation.   89 yo female with the past medical history of hypertension, dyslipidemia and depression who presented with dyspnea. Reported 4 days of worsening dyspnea, to the point of her having dyspnea with minimal efforts. No lower extremity edema. On her initial physical examination she was in respiratory distress, her RR was 40 and 02 saturation in the 80's, she was placed on Bipap, blood pressure 153/82, HR 80, rr 24 and 02 saturation 97% (bipap), lungs with rales with no wheezing, heart with S1 and S2 present and rhythmic, with no gallops or murmurs, abdomen with no distention and no lower extremity edema.   Na 134, K 3,7 Cl 95 bicarbonate 24, glucose 207 bun 11 cr 0,88  BNP 1,073  High sensitive troponin 32, 90, 165, 358.  Wbc 11,0 hgb 13,3 plt 366  Sars covid 19 negative   Chest radiograph with cardiomegaly, with bilateral hilar vascular congestion and bilateral increased lung markings, small bilateral pleural effusions.   EKG 96 bpm, left axis deviation, left bundle branch block, qtc not prolonged, sinus rhythm with PAC and PVC, poor R R wave progression, no significant ST segment or  T wave changes.   Patient was placed on furosemide for diuresis, and liberated from non invasive mechanical ventilation.  Echocardiogram with reduced LV systolic function.   03/23 clinically responding to medical therapy, no plans for invasive cardiac testing.   Assessment and Plan: Acute on chronic systolic CHF (congestive heart failure) (HCC) Echocardiogram with reduced LV systolic function with EF 30 to 35%, mild LVH, inferior septum, entire inferior wall, and apex are akinetic. The entire anterior wall, entire lateral wall and anterior septum  are hypokinetic. RV systolic function is preserved, no significant valvular disease.   Urine output is [id number]  Systolic blood pressure is 150 to 128 mmHg,   Plan to continue diuresis with furosemide.  Medical therapy with metoprolol and entresto.  To consider SGLT 2 inh and spironolactone.   HTN (hypertension), benign Blood pressure control with entresto and metoprolol.  Diuresis with furosemide.   AKI (acute kidney injury) ([place]) Hypokalemia, hyponatremia.   Renal function with serum cr at 1,0 with K at 3,7 and serum bicarbonate at 26, Na 137.   Continue diuresis with furosemide. To consider SGLT 2 inh and spironolactone.    Type 2 diabetes mellitus with hyperlipidemia (HCC) Glucose has been stable 180 to 148 for inpatient criteria.  Continue insulin sliding scale for glucose cover and monitoring.  HgbA1c 6.8   Add statin therapy.  Check lipid panel.   Depression Continue with duloxetine         Subjective: Patient is feeling better, no dyspnea or chest pain,   Physical Exam: Vitals:   02/17/23 0452 02/17/23 0843 02/17/23 1018 02/17/23 1300  BP: (!) 158/70  122/73 128/78  Pulse: 61 76 98 65  Resp: 20 18 18 20   Temp: (!) 97.4 F (36.3 C)   97.7 F (36.5 C)  TempSrc: Oral  Oral Oral  SpO2: 95% 94% 93% 92%  Weight:      Height:       Neurology awake and alert ENT with mild pallor Cardiovascular with S1 and S2 present and rhythmic with no gallops, rubs or murmurs No JVD No  lower extremity edema Respiratory with no rales or wheezing, no rhonchi Abdomen with no distention  Data Reviewed:    Family Communication: no family at the bedside   Disposition: Status is: Inpatient Remains inpatient appropriate because: heart failure   Planned Discharge Destination: Home      Author: Tawni Millers, MD 02/17/2023 3:47 PM  For on call review [email or web address].

## 2023-02-17 NOTE — Progress Notes (Signed)
Pt does not want to wear CPAP tonight. 

## 2023-02-17 NOTE — Hospital Course (Addendum)
Mrs. Christina Green was admitted to the hospital with the working diagnosis of heart failure exacerbation.   89 yo female with the past medical history of hypertension, dyslipidemia and depression who presented with dyspnea. Reported 4 days of worsening dyspnea, to the point of her having dyspnea with minimal efforts. No lower extremity edema. On her initial physical examination she was in respiratory distress, her RR was 40 and 02 saturation in the 80's, she was placed on Bipap, blood pressure 153/82, HR 80, rr 24 and 02 saturation 97% (bipap), lungs with rales with no wheezing, heart with S1 and S2 present and rhythmic, with no gallops or murmurs, abdomen with no distention and no lower extremity edema.   Na 134, K 3,7 Cl 95 bicarbonate 24, glucose 207 bun 11 cr 0,88  BNP 1,073  High sensitive troponin 32, 90, 165, 358.  Wbc 11,0 hgb 13,3 plt 366  Sars covid 19 negative   Chest radiograph with cardiomegaly, with bilateral hilar vascular congestion and bilateral increased lung markings, small bilateral pleural effusions.   EKG 96 bpm, left axis deviation, left bundle branch block, qtc not prolonged, sinus rhythm with PAC and PVC, poor R R wave progression, no significant ST segment or  T wave changes.   Patient was placed on furosemide for diuresis, and liberated from non invasive mechanical ventilation.  Echocardiogram with reduced LV systolic function.   03/23 clinically responding to medical therapy, no plans for invasive cardiac testing.  03/24 clinically improving plan for dc home tomorrow.

## 2023-02-17 NOTE — Assessment & Plan Note (Addendum)
Echocardiogram with reduced LV systolic function with EF 30 to 35%, mild LVH, inferior septum, entire inferior wall, and apex are akinetic. The entire anterior wall, entire lateral wall and anterior septum are hypokinetic. RV systolic function is preserved, no significant valvular disease.   Urine output is documented Q000111Q cc Systolic blood pressure is 150 to 145 mmHg,   Medical therapy with metoprolol and entresto.  Continue with oral furosemide and will add low dose spironolactone.  Possible SGLT 2 inh as outpatient.

## 2023-02-17 NOTE — Progress Notes (Signed)
   Rounding Note    Patient Name: KYARA BARTMAN Date of Encounter: 02/17/2023  [place] Cardiologist: Buford Dresser, MD   Subjective   NAEO. In chair. Granddaughter at bedside.  Inpatient Medications    Scheduled Meds:  aspirin EC  81 mg Oral Daily   DULoxetine  60 mg Oral Daily   enoxaparin (LOVENOX) injection  30 mg Subcutaneous Q24H   furosemide  40 mg Intravenous Q12H   insulin aspart  0-5 Units Subcutaneous QHS   insulin aspart  0-6 Units Subcutaneous TID WC   metoprolol succinate  50 mg Oral Daily   sacubitril-valsartan  1 tablet Oral BID   sodium chloride flush  3 mL Intravenous Q12H   Continuous Infusions:  PRN Meds: acetaminophen **OR** acetaminophen, senna-docusate   Vital Signs    Vitals:   02/16/23 1942 02/17/23 0144 02/17/23 0452 02/17/23 0843  BP: 135/64 133/64 (!) 158/70   Pulse: 74 (!) 59 61 76  Resp: 20 20 20 18   Temp: 97.7 F (36.5 C) (!) 97.5 F (36.4 C) (!) 97.4 F (36.3 C)   TempSrc: Oral Oral Oral   SpO2: 92% 94% 95% 94%  Weight:  62 kg    Height:        Intake/Output Summary (Last 24 hours) at 02/17/2023 0904 Last data filed at 02/17/2023 0400 Gross per 24 hour  Intake 840 ml  Output 1400 ml  Net -560 ml      02/17/2023    1:44 AM 02/16/2023    5:00 AM 02/15/2023    4:04 AM  Last 3 Weights  Weight (lbs) 136 lb 11 oz 132 lb 11.5 oz 128 lb 8.5 oz  Weight (kg) 62 kg 60.2 kg 58.3 kg      Telemetry    Personally Reviewed  ECG    LBBB, sinus - Personally Reviewed  Physical Exam   GEN: No acute distress.   Cardiac: RRR, no murmurs, rubs, or gallops.  Psych: Normal affect    Assessment & Plan    #Acute on chronic systolic heart failure EF 30%. NYHA III. Significant dyssynchyrony on TTE related to LBBB. Given advanced age, recommend medical management. - entresto - metop - lasix IV today, transition to oral Sunday, potentially home Monday.  #HTN Entresto Metop  #DM Per IM  For questions or  updates, please contact [place] Please consult [email or web address] for contact info under        Signed, Vickie Epley, MD  02/17/2023, 9:04 AM

## 2023-02-17 NOTE — Assessment & Plan Note (Signed)
Hypokalemia, hyponatremia.   Renal function with serum cr at 1,0 with K at 3,7 and serum bicarbonate at 26, Na 137.   Continue diuresis with furosemide. To consider SGLT 2 inh and spironolactone.

## 2023-02-17 NOTE — Assessment & Plan Note (Signed)
Continue with duloxetine.  

## 2023-02-17 NOTE — Assessment & Plan Note (Signed)
Blood pressure control with entresto and metoprolol.  Diuresis with furosemide.

## 2023-02-17 NOTE — Assessment & Plan Note (Addendum)
Glucose has been stable, will discontinue insulin therapy.   HgbA1c 6.8   Continue with statin therapy.  Check lipid panel.

## 2023-02-17 NOTE — Progress Notes (Signed)
Pt did not want to wear bipap at this time

## 2023-02-17 NOTE — Assessment & Plan Note (Deleted)
Add low dose statin and check lipid profile.

## 2023-02-18 DIAGNOSIS — E1169 Type 2 diabetes mellitus with other specified complication: Secondary | ICD-10-CM | POA: Diagnosis not present

## 2023-02-18 DIAGNOSIS — N179 Acute kidney failure, unspecified: Secondary | ICD-10-CM | POA: Diagnosis not present

## 2023-02-18 DIAGNOSIS — I5023 Acute on chronic systolic (congestive) heart failure: Secondary | ICD-10-CM | POA: Diagnosis not present

## 2023-02-18 DIAGNOSIS — I10 Essential (primary) hypertension: Secondary | ICD-10-CM | POA: Diagnosis not present

## 2023-02-18 LAB — GLUCOSE, CAPILLARY
Glucose-Capillary: 143 mg/dL — ABNORMAL HIGH (ref 70–99)
Glucose-Capillary: 156 mg/dL — ABNORMAL HIGH (ref 70–99)
Glucose-Capillary: 153 mg/dL — ABNORMAL HIGH (ref 70–99)
Glucose-Capillary: 153 mg/dL — ABNORMAL HIGH (ref 70–99)

## 2023-02-18 LAB — BASIC METABOLIC PANEL
Anion gap: 11 (ref 5–15)
BUN: 27 mg/dL — ABNORMAL HIGH (ref 8–23)
CO2: 26 mmol/L (ref 22–32)
Calcium: 9 mg/dL (ref 8.9–10.3)
Chloride: 100 mmol/L (ref 98–111)
Creatinine, Ser: 0.84 mg/dL (ref 0.44–1.00)
GFR, Estimated: >60 mL/min (ref >60)
Glucose, Bld: 148 mg/dL — ABNORMAL HIGH (ref 70–99)
Potassium: 3.2 mmol/L — ABNORMAL LOW (ref 3.5–5.1)
Sodium: 137 mmol/L (ref 135–145)

## 2023-02-18 LAB — LIPID PANEL
Cholesterol: 265 mg/dL — ABNORMAL HIGH (ref 0–200)
HDL: 44 mg/dL (ref >40)
LDL Cholesterol: 175 mg/dL — ABNORMAL HIGH (ref 0–99)
Total CHOL/HDL Ratio: 6 RATIO
Triglycerides: 228 mg/dL — ABNORMAL HIGH (ref <150)
VLDL: 46 mg/dL — ABNORMAL HIGH (ref 0–40)

## 2023-02-18 LAB — MAGNESIUM: Magnesium: 2.3 mg/dL (ref 1.7–2.4)

## 2023-02-18 MED ORDER — ATORVASTATIN CALCIUM 10 MG PO TABS
20.0000 mg | ORAL_TABLET | Freq: Every day | ORAL | Status: DC
  Administered 2023-02-18 – 2023-02-19 (×2): 20 mg via ORAL
  Filled 2023-02-18 (×2): qty 2

## 2023-02-18 MED ORDER — POTASSIUM CHLORIDE CRYS ER 20 MEQ PO TBCR
40.0000 meq | EXTENDED_RELEASE_TABLET | ORAL | Status: AC
  Administered 2023-02-18 (×2): 40 meq via ORAL
  Filled 2023-02-18 (×2): qty 2

## 2023-02-18 MED ORDER — FUROSEMIDE 20 MG PO TABS
20.0000 mg | ORAL_TABLET | Freq: Every day | ORAL | Status: DC
  Administered 2023-02-19: 20 mg via ORAL
  Filled 2023-02-18: qty 1

## 2023-02-18 MED ORDER — SPIRONOLACTONE 12.5 MG HALF TABLET: 12.5000 mg | ORAL_TABLET | Freq: Every day | ORAL | Status: DC

## 2023-02-18 MED ORDER — ENOXAPARIN SODIUM 40 MG/0.4ML IJ SOSY
40.0000 mg | PREFILLED_SYRINGE | INTRAMUSCULAR | Status: DC
  Administered 2023-02-18: 40 mg via SUBCUTANEOUS
  Filled 2023-02-18: qty 0.4

## 2023-02-18 MED ORDER — SPIRONOLACTONE 12.5 MG HALF TABLET
12.5000 mg | ORAL_TABLET | Freq: Every day | ORAL | Status: DC
  Administered 2023-02-18 – 2023-02-19 (×2): 12.5 mg via ORAL
  Filled 2023-02-18 (×2): qty 1

## 2023-02-18 NOTE — Progress Notes (Signed)
   Rounding Note    Patient Name: Christina Green Date of Encounter: 02/18/2023  [place] Cardiologist: Buford Dresser, MD   Subjective   NAEO. Sleeping this AM in recliner.  Inpatient Medications    Scheduled Meds:  aspirin EC  81 mg Oral Daily   DULoxetine  60 mg Oral Daily   enoxaparin (LOVENOX) injection  40 mg Subcutaneous Q24H   furosemide  40 mg Oral Daily   insulin aspart  0-5 Units Subcutaneous QHS   insulin aspart  0-6 Units Subcutaneous TID WC   metoprolol succinate  50 mg Oral Daily   sacubitril-valsartan  1 tablet Oral BID   sodium chloride flush  3 mL Intravenous Q12H   Continuous Infusions:  PRN Meds: acetaminophen **OR** acetaminophen, senna-docusate   Vital Signs    Vitals:   02/17/23 1300 02/17/23 2017 02/18/23 0116 02/18/23 0456  BP: 128/78 131/65 (!) 143/70 (!) 154/78  Pulse: 65 66 66 63  Resp: 20 20 20 20   Temp: 97.7 F (36.5 C) (!) 97.5 F (36.4 C) (!) 97.2 F (36.2 C) 97.7 F (36.5 C)  TempSrc: Oral Oral Axillary Oral  SpO2: 92% 95% 91% 93%  Weight:    60.7 kg  Height:        Intake/Output Summary (Last 24 hours) at 02/18/2023 0742 Last data filed at 02/18/2023 0300 Gross per 24 hour  Intake 600 ml  Output 700 ml  Net -100 ml       02/18/2023    4:56 AM 02/17/2023    1:44 AM 02/16/2023    5:00 AM  Last 3 Weights  Weight (lbs) 133 lb 13.1 oz 136 lb 11 oz 132 lb 11.5 oz  Weight (kg) 60.7 kg 62 kg 60.2 kg      Telemetry    Personally Reviewed  ECG    LBBB, sinus - Personally Reviewed  Physical Exam   GEN: No acute distress.   Cardiac: RRR, no murmurs, rubs, or gallops.  Psych: Normal affect    Assessment & Plan    #Acute on chronic systolic heart failure EF 30%. NYHA III. Significant dyssynchyrony on TTE related to LBBB. Given advanced age, recommend medical management. - entresto - metop - change to oral lasix today, anticipate home Monday  #HTN Entresto Metop  #DM Per IM  For  questions or updates, please contact [place] Please consult [email or web address] for contact info under        Signed, Vickie Epley, MD  02/18/2023, 7:42 AM

## 2023-02-18 NOTE — Progress Notes (Addendum)
Progress Note   Patient: Christina Green [id number] DOB: 03-12-1927 DOA: 02/14/2023     4 DOS: the patient was seen and examined on 02/18/2023   Brief hospital course: Christina Green was admitted to the hospital with the working diagnosis of heart failure exacerbation.   89 yo female with the past medical history of hypertension, dyslipidemia and depression who presented with dyspnea. Reported 4 days of worsening dyspnea, to the point of her having dyspnea with minimal efforts. No lower extremity edema. On her initial physical examination she was in respiratory distress, her RR was 40 and 02 saturation in the 80's, she was placed on Bipap, blood pressure 153/82, HR 80, rr 24 and 02 saturation 97% (bipap), lungs with rales with no wheezing, heart with S1 and S2 present and rhythmic, with no gallops or murmurs, abdomen with no distention and no lower extremity edema.   Na 134, K 3,7 Cl 95 bicarbonate 24, glucose 207 bun 11 cr 0,88  BNP 1,073  High sensitive troponin 32, 90, 165, 358.  Wbc 11,0 hgb 13,3 plt 366  Sars covid 19 negative   Chest radiograph with cardiomegaly, with bilateral hilar vascular congestion and bilateral increased lung markings, small bilateral pleural effusions.   EKG 96 bpm, left axis deviation, left bundle branch block, qtc not prolonged, sinus rhythm with PAC and PVC, poor R R wave progression, no significant ST segment or  T wave changes.   Patient was placed on furosemide for diuresis, and liberated from non invasive mechanical ventilation.  Echocardiogram with reduced LV systolic function.   03/23 clinically responding to medical therapy, no plans for invasive cardiac testing.  03/24 clinically improving plan for dc home tomorrow.   Assessment and Plan: Acute on chronic systolic CHF (congestive heart failure) (HCC) Echocardiogram with reduced LV systolic function with EF 30 to 35%, mild LVH, inferior septum, entire inferior wall, and apex are akinetic. The entire  anterior wall, entire lateral wall and anterior septum are hypokinetic. RV systolic function is preserved, no significant valvular disease.   Urine output is documented Q000111Q cc Systolic blood pressure is 150 to 145 mmHg,   Medical therapy with metoprolol and entresto.  Continue with oral furosemide and will add low dose spironolactone.  Possible SGLT 2 inh as outpatient.   HTN (hypertension), benign Blood pressure control with entresto and metoprolol.  Diuresis with furosemide, add spironolactone.   AKI (acute kidney injury) ([place]) Hypokalemia, hyponatremia.   Renal function continue to be stable with serum cr 0.84 with K at 3,2 and serum bicarbonate at 26. Na 137 and BUN 27.   Continue diuresis with furosemide and will add spironolactone.  Add kcl 40 meq x2 today to correct K.  Follow up renal function in am.     Type 2 diabetes mellitus with hyperlipidemia (HCC) Glucose has been stable, will discontinue insulin therapy.   HgbA1c 6.8   Continue with statin therapy.  Check lipid panel.   Depression Continue with duloxetine         Subjective: Patient is feeling better, she is out of bed to the chair, no chest pain or dyspnea, no edema, her granddaughter is at the bedside again this morning.   Physical Exam: Vitals:   02/18/23 0116 02/18/23 0456 02/18/23 0743 02/18/23 0800  BP: (!) 143/70 (!) 154/78  (!) 145/73  Pulse: 66 63 66 79  Resp: 20 20 18 17   Temp: (!) 97.2 F (36.2 C) 97.7 F (36.5 C)  97.7 F (36.5 C)  TempSrc: Axillary Oral  Oral  SpO2: 91% 93% 93% 93%  Weight:  60.7 kg    Height:       Neurology awake and alert ENT with mild pallor Cardiovascular with S1 and S2 present and rhythmic with no gallops, rubs or murmurs No JVD No lower extremity edema Respiratory with no rales or wheezing, no rhonchi Abdomen with no distention  Data Reviewed:    Family Communication: I spoke with patient's granddaughter at the bedside, we talked in detail about  patient's condition, plan of care and prognosis and all questions were addressed.   Disposition: Status is: Inpatient Remains inpatient appropriate because: heart failure   Planned Discharge Destination: Home plan for home tomorrow      Author: Tawni Millers, MD 02/18/2023 10:54 AM  For on call review [email or web address].

## 2023-02-18 NOTE — Progress Notes (Signed)
Pt refused to wear CPAP tonight. ?

## 2023-02-18 NOTE — Plan of Care (Signed)
  Problem: Education: Goal: Ability to demonstrate management of disease process will improve Outcome: Progressing Goal: Ability to verbalize understanding of medication therapies will improve Outcome: Progressing Goal: Individualized Educational Video(s) Outcome: Progressing   Problem: Activity: Goal: Capacity to carry out activities will improve Outcome: Progressing   Problem: Cardiac: Goal: Ability to achieve and maintain adequate cardiopulmonary perfusion will improve Outcome: Progressing   Problem: Education: Goal: Ability to describe self-care measures that may prevent or decrease complications (Diabetes Survival Skills Education) will improve Outcome: Progressing Goal: Individualized Educational Video(s) Outcome: Progressing   Problem: Coping: Goal: Ability to adjust to condition or change in health will improve Outcome: Progressing   Problem: Fluid Volume: Goal: Ability to maintain a balanced intake and output will improve Outcome: Progressing   Problem: Health Behavior/Discharge Planning: Goal: Ability to identify and utilize available resources and services will improve Outcome: Progressing Goal: Ability to manage health-related needs will improve Outcome: Progressing   Problem: Metabolic: Goal: Ability to maintain appropriate glucose levels will improve Outcome: Progressing   Problem: Nutritional: Goal: Maintenance of adequate nutrition will improve Outcome: Progressing Goal: Progress toward achieving an optimal weight will improve Outcome: Progressing   Problem: Skin Integrity: Goal: Risk for impaired skin integrity will decrease Outcome: Progressing   Problem: Tissue Perfusion: Goal: Adequacy of tissue perfusion will improve Outcome: Progressing   Problem: Education: Goal: Knowledge of General Education information will improve Description: Including pain rating scale, medication(s)/side effects and non-pharmacologic comfort measures Outcome:  Progressing   Problem: Health Behavior/Discharge Planning: Goal: Ability to manage health-related needs will improve Outcome: Progressing   Problem: Clinical Measurements: Goal: Ability to maintain clinical measurements within normal limits will improve Outcome: Progressing Goal: Will remain free from infection Outcome: Progressing Goal: Diagnostic test results will improve Outcome: Progressing Goal: Respiratory complications will improve Outcome: Progressing Goal: Cardiovascular complication will be avoided Outcome: Progressing   Problem: Activity: Goal: Risk for activity intolerance will decrease Outcome: Progressing   Problem: Nutrition: Goal: Adequate nutrition will be maintained Outcome: Progressing   Problem: Coping: Goal: Level of anxiety will decrease Outcome: Progressing   Problem: Elimination: Goal: Will not experience complications related to bowel motility Outcome: Progressing Goal: Will not experience complications related to urinary retention Outcome: Progressing   Problem: Pain Managment: Goal: General experience of comfort will improve Outcome: Progressing   Problem: Safety: Goal: Ability to remain free from injury will improve Outcome: Progressing   Problem: Skin Integrity: Goal: Risk for impaired skin integrity will decrease Outcome: Progressing   Problem: Education: Goal: Ability to demonstrate management of disease process will improve Outcome: Progressing Goal: Ability to verbalize understanding of medication therapies will improve Outcome: Progressing Goal: Individualized Educational Video(s) Outcome: Progressing   Problem: Activity: Goal: Capacity to carry out activities will improve Outcome: Progressing   Problem: Cardiac: Goal: Ability to achieve and maintain adequate cardiopulmonary perfusion will improve Outcome: Progressing

## 2023-02-19 ENCOUNTER — Other Ambulatory Visit (HOSPITAL_COMMUNITY): Payer: Self-pay

## 2023-02-19 DIAGNOSIS — I5023 Acute on chronic systolic (congestive) heart failure: Secondary | ICD-10-CM | POA: Diagnosis not present

## 2023-02-19 DIAGNOSIS — N179 Acute kidney failure, unspecified: Secondary | ICD-10-CM | POA: Diagnosis not present

## 2023-02-19 DIAGNOSIS — I10 Essential (primary) hypertension: Secondary | ICD-10-CM | POA: Diagnosis not present

## 2023-02-19 DIAGNOSIS — E1169 Type 2 diabetes mellitus with other specified complication: Secondary | ICD-10-CM | POA: Diagnosis not present

## 2023-02-19 DIAGNOSIS — I509 Heart failure, unspecified: Secondary | ICD-10-CM

## 2023-02-19 LAB — CULTURE, BLOOD (ROUTINE X 2)
Culture: NO GROWTH
Culture: NO GROWTH

## 2023-02-19 LAB — GLUCOSE, CAPILLARY
Glucose-Capillary: 144 mg/dL — ABNORMAL HIGH (ref 70–99)
Glucose-Capillary: 225 mg/dL — ABNORMAL HIGH (ref 70–99)

## 2023-02-19 LAB — BASIC METABOLIC PANEL
Anion gap: 11 (ref 5–15)
BUN: 25 mg/dL — ABNORMAL HIGH (ref 8–23)
CO2: 24 mmol/L (ref 22–32)
Calcium: 9.2 mg/dL (ref 8.9–10.3)
Chloride: 103 mmol/L (ref 98–111)
Creatinine, Ser: 0.94 mg/dL (ref 0.44–1.00)
GFR, Estimated: 56 mL/min — ABNORMAL LOW (ref >60)
Glucose, Bld: 148 mg/dL — ABNORMAL HIGH (ref 70–99)
Potassium: 4.6 mmol/L (ref 3.5–5.1)
Sodium: 138 mmol/L (ref 135–145)

## 2023-02-19 MED ORDER — METOPROLOL SUCCINATE ER 50 MG PO TB24
50.0000 mg | ORAL_TABLET | Freq: Every day | ORAL | 0 refills | Status: DC
  Filled 2023-02-19: qty 30, 30d supply, fill #0

## 2023-02-19 MED ORDER — ATORVASTATIN CALCIUM 20 MG PO TABS
20.0000 mg | ORAL_TABLET | Freq: Every day | ORAL | 0 refills | Status: AC
  Filled 2023-02-19: qty 30, 30d supply, fill #0

## 2023-02-19 MED ORDER — ENOXAPARIN SODIUM 30 MG/0.3ML IJ SOSY: 30.0000 mg | PREFILLED_SYRINGE | INTRAMUSCULAR | Status: DC

## 2023-02-19 MED ORDER — FUROSEMIDE 20 MG PO TABS: 20.0000 mg | ORAL_TABLET | Freq: Every day | ORAL | Status: DC | PRN

## 2023-02-19 MED ORDER — FUROSEMIDE 20 MG PO TABS
20.0000 mg | ORAL_TABLET | Freq: Every day | ORAL | 0 refills | Status: AC | PRN
  Filled 2023-02-19: qty 30, 30d supply, fill #0

## 2023-02-19 MED ORDER — SPIRONOLACTONE 25 MG PO TABS
12.5000 mg | ORAL_TABLET | Freq: Every day | ORAL | 0 refills | Status: DC
  Filled 2023-02-19: qty 15, 30d supply, fill #0

## 2023-02-19 MED ORDER — SACUBITRIL-VALSARTAN 24-26 MG PO TABS
1.0000 | ORAL_TABLET | Freq: Two times a day (BID) | ORAL | 0 refills | Status: DC
  Filled 2023-02-19: qty 60, 30d supply, fill #0

## 2023-02-19 MED ORDER — ASPIRIN 81 MG PO TBEC
81.0000 mg | DELAYED_RELEASE_TABLET | Freq: Every day | ORAL | 12 refills | Status: DC
  Filled 2023-02-19: qty 30, 30d supply, fill #0
  Filled 2023-05-02: qty 30, 30d supply, fill #1

## 2023-02-19 NOTE — Progress Notes (Signed)
Heart Failure Navigator Progress Note  Assessed for Heart & Vascular TOC clinic readiness.  Patient EF30-35%, has a cardiology follow up on 03/06/2023 at [facility].   Navigator available for reassessment of patient.   Earnestine Leys, BSN, Clinical cytogeneticist Only

## 2023-02-19 NOTE — TOC Transition Note (Addendum)
Transition of Care [facility]) - CM/SW Discharge Note   Patient Details  Name: SHANTAL WLODARCZYK MRN: [id number] Date of Birth: [birth year]  Transition of Care [facility]) CM/SW Contact:  Zenon Mayo, RN Phone Number: 02/19/2023, 10:05 AM   Clinical Narrative:    Patient is for dc today, NCM notified Callvin with [facility] of the dc. [facility] is at the bedside to transport her home. His phone is [phone].  TOC to fill meds.   Final next level of care: Home w Home Health Services Barriers to Discharge: Continued Medical Work up   Patient Goals and CMS Choice CMS Medicare.gov Compare Post Acute Care list provided to:: Patient Choice offered to / list presented to : Patient  Discharge Placement                         Discharge Plan and Services Additional resources added to the After Visit Summary for     Discharge Planning Services: CM Consult Post Acute Care Choice: Home Health          DME Arranged: N/A         HH Arranged: RN, Disease Management, PT, OT HH Agency: [facility] Date [place]: 02/15/23 Time [place]: [id number] Representative spoke with at [place]: [place] Determinants of Health ([place]) Interventions SDOH Screenings   Tobacco Use: Unknown (02/14/2023)     Readmission Risk Interventions     No data to display

## 2023-02-19 NOTE — Progress Notes (Signed)
Discharge instructions provided to patient and granddaughter.  Patient discharged to home via wheelchair by NT with all belongings at 1350 on 02/19/23.

## 2023-02-19 NOTE — Progress Notes (Signed)
Physical Therapy Treatment Patient Details Name: Christina Green MRN: [id number] DOB: 1927/04/05 Today's Date: 02/19/2023   History of Present Illness 89 yo female with onset of SOB was admitted on 3/20 and found elevated troponin, cardiomegaly, pleural effusion and pulm edema, with CHF dx.  PMHx:  HTN, depression, HLD, L BBB, OA knee, shingles, HOH,    PT Comments    Pt is up to walk with no O2 and maintaining sats on room air with no SOB or request to stop.  Her use of RW is baseline, and will recommend updating to HHPT for follow up since pt is doing well and has progressed from her eval status.  Follow up with information as well on home safety devices as available at this setting with son, who is concerned that chance call to her identified her medical change and pt wishes to still live alone.  Follow acutely as her stay permits.  Recommendations for follow up therapy are one component of a multi-disciplinary discharge planning process, led by the attending physician.  Recommendations may be updated based on patient status, additional functional criteria and insurance authorization.  Follow Up Recommendations  Can patient physically be transported by private vehicle: Yes    Assistance Recommended at Discharge Intermittent Supervision/Assistance  Patient can return home with the following A little help with walking and/or transfers;A little help with bathing/dressing/bathroom;Assistance with cooking/housework;Help with stairs or ramp for entrance   Equipment Recommendations  None recommended by PT    Recommendations for Other Services       Precautions / Restrictions Precautions Precautions: Fall Precaution Comments: monitor O2 sats Restrictions Weight Bearing Restrictions: No     Mobility  Bed Mobility Overal bed mobility: Needs Assistance Bed Mobility: Supine to Sit     Supine to sit: Supervision          Transfers Overall transfer level: Needs  assistance Equipment used: Rolling walker (2 wheels) Transfers: Sit to/from Stand Sit to Stand: Min guard           General transfer comment: very stable to stand    Ambulation/Gait Ambulation/Gait assistance: Supervision, Min guard Gait Distance (Feet): 300 Feet Assistive device: Rolling walker (2 wheels) Gait Pattern/deviations: Step-through pattern, Narrow base of support, Decreased stride length Gait velocity: reduced Gait velocity interpretation: <1.31 ft/sec, indicative of household ambulator   General Gait Details: maintaining sats in 90's with all gait on room air   Stairs             Wheelchair Mobility    Modified Rankin (Stroke Patients Only)       Balance Overall balance assessment: Needs assistance Sitting-balance support: Feet supported Sitting balance-Leahy Scale: Good     Standing balance support: Bilateral upper extremity supported, During functional activity Standing balance-Leahy Scale: Fair                              Cognition Arousal/Alertness: Awake/alert Behavior During Therapy: WFL for tasks assessed/performed Overall Cognitive Status: Within Functional Limits for tasks assessed                                 General Comments: wearing augented hearing devices        Exercises      General Comments General comments (skin integrity, edema, etc.): pt is maintaining sats at lowest 91% and highest 94% with standing and gait, post gait  Pertinent Vitals/Pain Pain Assessment Pain Assessment: No/denies pain    Home Living                          Prior Function            PT Goals (current goals can now be found in the care plan section) Acute Rehab PT Goals Patient Stated Goal: to get home and feel better Progress towards PT goals: Progressing toward goals    Frequency    Min 1X/week      PT Plan Discharge plan needs to be updated    Co-evaluation               AM-PAC PT "6 Clicks" Mobility   Outcome Measure  Help needed turning from your back to your side while in a flat bed without using bedrails?: None Help needed moving from lying on your back to sitting on the side of a flat bed without using bedrails?: None Help needed moving to and from a bed to a chair (including a wheelchair)?: A Little Help needed standing up from a chair using your arms (e.g., wheelchair or bedside chair)?: A Little Help needed to walk in hospital room?: A Little Help needed climbing 3-5 steps with a railing? : A Little 6 Click Score: 20    End of Session Equipment Utilized During Treatment: Gait belt Activity Tolerance: Patient tolerated treatment well Patient left: in chair;with call bell/phone within reach;with chair alarm set;with family/visitor present Nurse Communication: Mobility status PT Visit Diagnosis: Unsteadiness on feet (R26.81);Muscle weakness (generalized) (M62.81);Difficulty in walking, not elsewhere classified (R26.2);Dizziness and giddiness (R42)     Time: [id number] PT Time Calculation (min) (ACUTE ONLY): 25 min  Charges:  $Gait Training: 8-22 mins $Therapeutic Activity: 8-22 mins        Ramond Dial 02/19/2023, 11:38 AM  Mee Hives, PT PhD Acute Rehab Dept. Number: [place] [facility] [place]

## 2023-02-19 NOTE — Discharge Summary (Addendum)
Physician Discharge Summary   Patient: Christina Green MRN: [id number] DOB: Sep 10, 1927  Admit date:     02/14/2023  Discharge date: 02/19/23  Discharge Physician: [place]   PCP: Lavone Orn, MD   Recommendations at discharge:    Patient has been placed on heart failure guideline directed medical therapy with entresto, metoprolol succinate and spironolactone. Take furosemide as needed for volume overload, weight gain 2 to 3 lbs in 24 hrs or 5 lbs in 7 days.  Added aspirin and statin for coronary artery disease.  Follow up with renal function and electrolytes in 7 days.  Follow up with Dr Laurann Montana in 7 to 10 days.  Follow up with Cardiology as scheduled. Outpatient sleep study, she was noted to have 02 desaturation when sleeping.   Discharge Diagnoses: Active Problems:   Acute on chronic systolic CHF (congestive heart failure) (HCC)   HTN (hypertension), benign   AKI (acute kidney injury) ([place])   Type 2 diabetes mellitus with hyperlipidemia (HCC)   Depression   Acute on chronic congestive heart failure ([place])  Resolved Problems:   * No resolved hospital problems. [facility] Course: Christina Green was admitted to the hospital with the working diagnosis of heart failure exacerbation.   89 yo female with the past medical history of hypertension, dyslipidemia and depression who presented with dyspnea. Reported 4 days of worsening dyspnea, to the point of her having dyspnea with minimal efforts. No lower extremity edema. On her initial physical examination she was in respiratory distress, her RR was 40 and 02 saturation in the 80's, she was placed on Bipap, blood pressure 153/82, HR 80, rr 24 and 02 saturation 97% (bipap), lungs with rales with no wheezing, heart with S1 and S2 present and rhythmic, with no gallops or murmurs, abdomen with no distention and no lower extremity edema.   Na 134, K 3,7 Cl 95 bicarbonate 24, glucose 207 bun 11 cr 0,88  BNP 1,073  High sensitive  troponin 32, 90, 165, 358.  Wbc 11,0 hgb 13,3 plt 366  Sars covid 19 negative   Chest radiograph with cardiomegaly, with bilateral hilar vascular congestion and bilateral increased lung markings, small bilateral pleural effusions.   EKG 96 bpm, left axis deviation, left bundle branch block, qtc not prolonged, sinus rhythm with PAC and PVC, poor R R wave progression, no significant ST segment or  T wave changes.   Patient was placed on furosemide for diuresis, and liberated from non invasive mechanical ventilation.  Echocardiogram with reduced LV systolic function.   03/23 clinically responding to medical therapy, no plans for invasive cardiac testing.  03/24 clinically improving plan for dc home tomorrow.   Assessment and Plan: Acute on chronic systolic CHF (congestive heart failure) (HCC) Echocardiogram with reduced LV systolic function with EF 30 to 35%, mild LVH, inferior septum, entire inferior wall, and apex are akinetic. The entire anterior wall, entire lateral wall and anterior septum are hypokinetic. RV systolic function is preserved, no significant valvular disease.   Urine output is documented [id number] cc Systolic blood pressure is 150 to 145 mmHg,   Medical therapy with metoprolol and entresto.  Continue with oral furosemide and will add low dose spironolactone.  Possible SGLT 2 inh as outpatient.   HTN (hypertension), benign Blood pressure control with entresto and metoprolol.  Diuresis with furosemide, add spironolactone.   AKI (acute kidney injury) ([place]) Hypokalemia, hyponatremia.   Renal function continue to be stable with serum cr 0.84 with K at 3,2  and serum bicarbonate at 26. Na 137 and BUN 27.   Continue diuresis with furosemide and will add spironolactone.  Add kcl 40 meq x2 today to correct K.  Follow up renal function in am.     Type 2 diabetes mellitus with hyperlipidemia (HCC) Glucose has been stable, will discontinue insulin therapy.   HgbA1c 6.8    Continue with statin therapy.  Check lipid panel.   Depression Continue with duloxetine          Consultants: cardiology  Procedures performed: none   Disposition: Home Diet recommendation:  Cardiac diet DISCHARGE MEDICATION: Allergies as of 02/19/2023       Reactions   Penicillins Other (See Comments)   unknown        Medication List     STOP taking these medications    azithromycin 250 MG tablet Commonly known as: ZITHROMAX   labetalol 100 MG tablet Commonly known as: NORMODYNE   losartan-hydrochlorothiazide 100-12.5 MG tablet Commonly known as: HYZAAR       TAKE these medications    aspirin EC 81 MG tablet Take 1 tablet (81 mg total) by mouth daily. Swallow whole. Start taking on: February 20, 2023   atorvastatin 20 MG tablet Commonly known as: LIPITOR Take 1 tablet (20 mg total) by mouth daily. Start taking on: February 20, 2023   DULoxetine 60 MG capsule Commonly known as: CYMBALTA Take 60 mg by mouth daily. For depression   furosemide 20 MG tablet Commonly known as: LASIX Take 1 tablet (20 mg total) by mouth daily as needed for edema or fluid (take in case of weight gain 2 to 3 lbs in 24 hrs or 5 lbs in 7 days.).   metoprolol succinate 50 MG 24 hr tablet Commonly known as: TOPROL-XL Take 1 tablet (50 mg total) by mouth daily. Take with or immediately following a meal. Start taking on: February 20, 2023   PRESERVISION AREDS PO Take 1 capsule by mouth in the morning and at bedtime.   sacubitril-valsartan 24-26 MG Commonly known as: ENTRESTO Take 1 tablet by mouth 2 (two) times daily.   spironolactone 25 MG tablet Commonly known as: ALDACTONE Take 0.5 tablets (12.5 mg total) by mouth daily. Start taking on: February 20, 2023        [facility] Follow up.   Specialty: Home Health Services Why: Agency will call you to set up apt times Contact information: [place] [address] [phone]                Discharge Exam: Filed Weights   02/17/23 0144 02/18/23 0456 02/19/23 0137  Weight: 62 kg 60.7 kg 60.4 kg   BP 133/69   Pulse 66   Temp (!) 97.5 F (36.4 C) (Oral)   Resp 16   Ht 4\' 11"  (1.499 m)   Wt 60.4 kg   SpO2 96%   BMI 26.89 kg/m   Patient is feeling well, no dyspnea, no chest pain, no orthopnea or PND  Neurology awake and alert ENT with mild pallor Cardiovascular with S1 and S2 present and rhythmic with no gallops rubs or murmurs No JVD No lower extremity edema Respiratory with no rales or wheezing Abdomen with no distention   Condition at discharge: stable  The results of significant diagnostics from this hospitalization (including imaging, microbiology, ancillary and laboratory) are listed below for reference.   Imaging Studies: ECHOCARDIOGRAM COMPLETE  Result Date: 02/15/2023    ECHOCARDIOGRAM REPORT   Patient Name:   Christina Green Name Date of Exam: 02/15/2023 Medical Rec #:  [id number]        Height:       59.0 in Accession #:    [id number]       Weight:       128.5 lb Date of Birth:  [birth year]         BSA:          1.528 m Patient Age:    50 years         BP:           145/86 mmHg Patient Gender: F                HR:           71 bpm. Exam Location:  Inpatient Procedure: 2D Echo, Intracardiac Opacification Agent, Cardiac Doppler and Color            Doppler Indications:    CHF  History:        Patient has no prior history of Echocardiogram examinations.                 CHF; Risk Factors:Hypertension.  Sonographer:    Marella Chimes Referring Phys: [id number] [place]  1. Left ventricular ejection fraction, by estimation, is 30 to 35%. The left ventricle has moderately decreased function. The left ventricle demonstrates regional wall motion abnormalities (see scoring diagram/findings for description). There is mild concentric left ventricular hypertrophy. Left ventricular diastolic parameters are  consistent with Grade I diastolic dysfunction (impaired relaxation). Elevated left atrial pressure.  2. Right ventricular systolic function is normal. The right ventricular size is normal.  3. Left atrial size was mildly dilated.  4. The mitral valve is normal in structure. Trivial mitral valve regurgitation. No evidence of mitral stenosis.  5. The aortic valve is normal in structure. Aortic valve regurgitation is mild. Aortic valve sclerosis is present, with no evidence of aortic valve stenosis.  6. There is mild dilatation of the ascending aorta, measuring 38 mm.  7. The inferior vena cava is normal in size with greater than 50% respiratory variability, suggesting right atrial pressure of 3 mmHg. FINDINGS  Left Ventricle: Left ventricular ejection fraction, by estimation, is 30 to 35%. The left ventricle has moderately decreased function. The left ventricle demonstrates regional wall motion abnormalities. The left ventricular internal cavity size was normal in size. There is mild concentric left ventricular hypertrophy. Left ventricular diastolic parameters are consistent with Grade I diastolic dysfunction (impaired relaxation). Elevated left atrial pressure.  LV Wall Scoring: The inferior septum, entire inferior wall, and apex are akinetic. The entire anterior wall, entire lateral wall, and anterior septum are hypokinetic. Right Ventricle: The right ventricular size is normal. No increase in right ventricular wall thickness. Right ventricular systolic function is normal. Left Atrium: Left atrial size was mildly dilated. Right Atrium: Right atrial size was normal in size. Pericardium: There is no evidence of pericardial effusion. Presence of epicardial fat layer. Mitral Valve: The mitral valve is normal in structure. Trivial mitral valve regurgitation. No evidence of mitral valve stenosis. Tricuspid Valve: The tricuspid valve is normal in structure. Tricuspid valve regurgitation is not demonstrated. No evidence of  tricuspid stenosis. Aortic Valve: The aortic valve is normal in structure. Aortic valve regurgitation is mild. Aortic valve sclerosis is present, with no evidence of aortic valve stenosis. Aortic valve mean gradient measures 5.0 mmHg.  Aortic valve peak gradient measures 8.5  mmHg. Pulmonic Valve: The pulmonic valve was normal in structure. Pulmonic valve regurgitation is not visualized. No evidence of pulmonic stenosis. Aorta: There is mild dilatation of the ascending aorta, measuring 38 mm. Venous: The inferior vena cava is normal in size with greater than 50% respiratory variability, suggesting right atrial pressure of 3 mmHg. IAS/Shunts: No atrial level shunt detected by color flow Doppler.  LEFT VENTRICLE PLAX 2D LVIDd:         4.60 cm      Diastology LVIDs:         3.90 cm      LV e' medial:    2.28 cm/s LV PW:         1.30 cm      LV E/e' medial:  38.2 LV IVS:        1.30 cm      LV e' lateral:   4.13 cm/s                             LV E/e' lateral: 21.1  LV Volumes (MOD) LV vol d, MOD A4C: 101.0 ml LV vol s, MOD A4C: 66.3 ml LV SV MOD A4C:     101.0 ml RIGHT VENTRICLE RV S prime:     10.20 cm/s TAPSE (M-mode): 1.4 cm LEFT ATRIUM             Index        RIGHT ATRIUM           Index LA diam:        3.80 cm 2.49 cm/m   RA Area:     10.80 cm LA Vol (A2C):   67.9 ml 44.43 ml/m  RA Volume:   22.40 ml  14.66 ml/m LA Vol (A4C):   44.3 ml 28.98 ml/m LA Biplane Vol: 57.6 ml 37.69 ml/m  AORTIC VALVE AV Vmax:           146.00 cm/s AV Vmean:          100.000 cm/s AV VTI:            0.317 m AV Peak Grad:      8.5 mmHg AV Mean Grad:      5.0 mmHg LVOT Vmax:         83.00 cm/s LVOT Vmean:        48.200 cm/s LVOT VTI:          0.169 m LVOT/AV VTI ratio: 0.53  AORTA Ao Root diam: 2.80 cm Ao Asc diam:  3.80 cm MITRAL VALVE MV Area (PHT): 3.99 cm     SHUNTS MV Decel Time: 190 msec     Systemic VTI: 0.17 m MV E velocity: 87.00 cm/s MV A velocity: 104.00 cm/s MV E/A ratio:  0.84 Kardie Tobb DO Electronically signed by  Berniece Salines DO Signature Date/Time: 02/15/2023/11:24:27 AM    Final    DG Chest Port 1 View  Result Date: 02/14/2023 CLINICAL DATA:  Shortness of breath EXAM: PORTABLE CHEST 1 VIEW COMPARISON:  Previous studies including the examination of 11/28/2022 FINDINGS: Transverse diameter of heart is increased. Central pulmonary vessels are more prominent. There is interval increase in interstitial markings in both lungs, especially in parahilar regions and lower lung fields. There is no focal pulmonary consolidation. There is blunting of both lateral CP angles. There is no pneumothorax. IMPRESSION: Cardiomegaly. CHF with interstitial pulmonary edema. Small bilateral pleural effusions. Electronically  Signed   By: Elmer Picker M.D.   On: 02/14/2023 17:15    Microbiology: Results for orders placed or performed during the hospital encounter of 02/14/23  Resp panel by RT-PCR (RSV, Flu A&B, Covid) Anterior Nasal Swab     Status: None   Collection Time: 02/14/23  4:48 PM   Specimen: Anterior Nasal Swab  Result Value Ref Range Status   SARS Coronavirus 2 by RT PCR NEGATIVE NEGATIVE Final   Influenza A by PCR NEGATIVE NEGATIVE Final   Influenza B by PCR NEGATIVE NEGATIVE Final    Comment: (NOTE) The Xpert Xpress SARS-CoV-2/FLU/RSV plus assay is intended as an aid in the diagnosis of influenza from Nasopharyngeal swab specimens and should not be used as a sole basis for treatment. Nasal washings and aspirates are unacceptable for Xpert Xpress SARS-CoV-2/FLU/RSV testing.  Fact Sheet for Patients: EntrepreneurPulse.com.au  Fact Sheet for Healthcare Providers: IncredibleEmployment.be  This test is not yet approved or cleared by the [place] FDA and has been authorized for detection and/or diagnosis of SARS-CoV-2 by FDA under an Emergency Use Authorization (EUA). This EUA will remain in effect (meaning this test can be used) for the duration of the COVID-19  declaration under Section 564(b)(1) of the Act, 21 U.S.C. section 360bbb-3(b)(1), unless the authorization is terminated or revoked.     Resp Syncytial Virus by PCR NEGATIVE NEGATIVE Final    Comment: (NOTE) Fact Sheet for Patients: EntrepreneurPulse.com.au  Fact Sheet for Healthcare Providers: IncredibleEmployment.be  This test is not yet approved or cleared by the [place] FDA and has been authorized for detection and/or diagnosis of SARS-CoV-2 by FDA under an Emergency Use Authorization (EUA). This EUA will remain in effect (meaning this test can be used) for the duration of the COVID-19 declaration under Section 564(b)(1) of the Act, 21 U.S.C. section 360bbb-3(b)(1), unless the authorization is terminated or revoked.  Performed at [facility], [place] [address], [place], [place] [address]   Blood culture (routine x 2)     Status: None   Collection Time: 02/14/23  5:00 PM   Specimen: BLOOD  Result Value Ref Range Status   Specimen Description BLOOD SITE NOT SPECIFIED  Final   Special Requests   Final    BOTTLES DRAWN AEROBIC AND ANAEROBIC Blood Culture results may not be optimal due to an inadequate volume of blood received in culture bottles   Culture   Final    NO GROWTH 5 DAYS Performed at [facility], [place] [address] [place], [place] [address]    Report Status 02/19/2023 FINAL  Final  Blood culture (routine x 2)     Status: None   Collection Time: 02/14/23  5:17 PM   Specimen: BLOOD  Result Value Ref Range Status   Specimen Description BLOOD RIGHT ANTECUBITAL  Final   Special Requests   Final    BOTTLES DRAWN AEROBIC AND ANAEROBIC Blood Culture results may not be optimal due to an excessive volume of blood received in culture bottles   Culture   Final    NO GROWTH 5 DAYS Performed at [facility], [place] [address], [place],  [address]    Report Status 02/19/2023 FINAL  Final    Labs: CBC: Recent  Labs  Lab 02/14/23 1645 02/14/23 1730 02/15/23 0024 02/16/23 0033 02/17/23 0026  WBC 11.0*  --  10.1 13.4* 10.0  HGB 13.3 12.9 13.2 11.8* 13.5  HCT 39.2 38.0 37.4 35.5* 41.6  MCV 94.0  --  89.5 94.2 96.5  PLT 366  --  296 288 [id number]   Basic Metabolic Panel: Recent Labs  Lab 02/15/23 0024 02/16/23 0033 02/17/23 0026 02/18/23 0049 02/19/23 0044  NA 133* 136 137 137 138  K 2.8* 4.1 3.7 3.2* 4.6  CL 95* 101 95* 100 103  CO2 27 27 26 26 24   GLUCOSE 180* 148* 140* 148* 148*  BUN 12 25* 27* 27* 25*  CREATININE 0.73 1.05* 1.01* 0.84 0.94  CALCIUM 8.5* 8.6* 9.4 9.0 9.2  MG 2.3  --   --  2.3  --    Liver Function Tests: Recent Labs  Lab 02/14/23 1645  AST 36  ALT 26  ALKPHOS 83  BILITOT 1.0  PROT 7.0  ALBUMIN 3.8   CBG: Recent Labs  Lab 02/18/23 0617 02/18/23 1140 02/18/23 1622 02/18/23 2107 02/19/23 0606  GLUCAP 143* 156* 153* 153* 144*    Discharge time spent: greater than 30 minutes.  Signed: Tawni Millers, MD [facility] 02/19/2023

## 2023-02-19 NOTE — Care Management Important Message (Signed)
Important Message  Patient Details  Name: Christina Green MRN: [id number] Date of Birth: May 09, 1927   Medicare Important Message Given:  Yes     Shelda Altes 02/19/2023, 9:55 AM

## 2023-03-06 ENCOUNTER — Ambulatory Visit (HOSPITAL_BASED_OUTPATIENT_CLINIC_OR_DEPARTMENT_OTHER): Payer: Medicare HMO | Admitting: Family

## 2023-03-20 NOTE — Progress Notes (Addendum)
Cardiology Office Note:    Date:  03/21/2023   ID:  Christina Green, DOB 09-24-1927, MRN [id number]  PCP:  Christina Officer, PA [facility] Cardiologist: needs to establish cardiologist  Reason for visit: Hospital follow-up  History of Present Illness:    Christina Green is a 89 y.o. female with a hx of hypertension, hyperlipidemia, depression, diabetes, LBBB.  Patient reported pneumonia diagnosis in November 2023 with persistent shortness of breath following that.  She is admitted to the hospital in late March 2024 for with 4 days of worsening dyspnea.  She was in respiratory distress with O2 saturations in the 80s.  Placed on BiPAP.  Chest x-ray showed vascular congestion and small bilateral pleural effusions.  She was diuresed with Lasix.  Echo showed EF of 30 to 35% with inferior wall and apex akinesis and anterior and lateral wall hypokinesis.  No significant valve disease.  As she responded to therapy, ischemic evaluation was deferred after shared decision making between patient and cardiology.  She was treated with metoprolol, Entresto, Lasix and spironolactone.  Today, she comes in with her daughter.  She feels the best she has in a year.  She can walk to her mailbox (long driveway) without feeling short of breath.  Patient also feels like her balance/walking is better.  She does not need to use a cane.  She lives alone but is active.  She cooks dinner for her family several days per week.  She states no side effects from medications.  Her weight has been stable at 126 to 127 pounds.  She accidentally took Lasix 20 mg daily after discharge and then noticed 1 week ago that it was to be as needed.  She has not needed to take Lasix over the last week.  She denies shortness of breath, PND, orthopnea and lower extremity edema.  No chest pain, lightheadedness or palpitations.  Her blood pressure is better than it was before hospital mission; she would always run high.     Past  Medical History:  Diagnosis Date   Adrenal adenoma    benign   Depression    takes Cymbalta daily   Diarrhea    "often" takes Immodium as needed   DJD (degenerative joint disease) of knee    and hands   Graves disease    Headache    nightly   History of kidney stones    History of shingles    Hyperlipemia    takes Atorvastatin daily   Hypertension    takes Hyzaar and Labetalol daily   Impaired hearing    wears hearing aides   Joint pain    Joint swelling    Left bundle branch block (LBBB)    identified in 2011   Psoriasis     Past Surgical History:  Procedure Laterality Date   CARDIAC CATHETERIZATION     CATARACT EXTRACTION     CHOLECYSTECTOMY     colonscopy     KNEE ARTHROSCOPY W/ MENISCAL REPAIR Right    REPLACEMENT TOTAL KNEE Left    TOTAL KNEE ARTHROPLASTY Right 01/31/2016   Procedure: RIGHT TOTAL KNEE ARTHROPLASTY;  Surgeon: Jodi Geralds, MD;  Location: [facility] OR;  Service: Orthopedics;  Laterality: Right;    Current Medications: Current Meds  Medication Sig   aspirin EC 81 MG tablet Take 1 tablet (81 mg total) by mouth daily. Swallow whole.   atorvastatin (LIPITOR) 20 MG tablet Take 1 tablet (20 mg total) by mouth daily.  DULoxetine (CYMBALTA) 60 MG capsule Take 60 mg by mouth daily. For depression   furosemide (LASIX) 20 MG tablet Take 1 tablet (20 mg total) by mouth daily as needed for edema or fluid (take in case of weight gain 2 to 3 lbs in 24 hrs or 5 lbs in 7 days.).   metoprolol succinate (TOPROL-XL) 50 MG 24 hr tablet Take 1 tablet (50 mg total) by mouth daily. Take with or immediately following a meal.   Multiple Vitamins-Minerals (PRESERVISION AREDS PO) Take 1 capsule by mouth in the morning and at bedtime.   sacubitril-valsartan (ENTRESTO) 24-26 MG Take 1 tablet by mouth 2 (two) times daily.   spironolactone (ALDACTONE) 25 MG tablet Take 0.5 tablets (12.5 mg total) by mouth daily.     Allergies:   Penicillins   Social History   Socioeconomic History    Marital status: Widowed    Spouse name: Not on file   Number of children: Not on file   Years of education: Not on file   Highest education level: Not on file  Occupational History   Not on file  Tobacco Use   Smoking status: Never   Smokeless tobacco: Not on file  Substance and Sexual Activity   Alcohol use: Yes    Comment: wine occasionally   Drug use: No   Sexual activity: Not on file  Other Topics Concern   Not on file  Social History Narrative   Not on file   Social Determinants of Health   Financial Resource Strain: Not on file  Food Insecurity: Not on file  Transportation Needs: Not on file  Physical Activity: Not on file  Stress: Not on file  Social Connections: Not on file     Family History: The patient's family history includes CVA in her sister; Heart disease in her father and mother; Hypertension in her father, mother, and sister.  ROS:   Please see the history of present illness.     EKGs/Labs/Other Studies Reviewed:    Recent Labs: 02/14/2023: ALT 26; B Natriuretic Peptide 1,073.4 02/17/2023: Hemoglobin 13.5; Platelets 306 02/18/2023: Magnesium 2.3 02/19/2023: BUN 25; Creatinine, Ser 0.94; Potassium 4.6; Sodium 138   Recent Lipid Panel Lab Results  Component Value Date/Time   CHOL 265 (H) 02/18/2023 12:49 AM   TRIG 228 (H) 02/18/2023 12:49 AM   HDL 44 02/18/2023 12:49 AM   LDLCALC 175 (H) 02/18/2023 12:49 AM    Physical Exam:    VS:  BP 132/78   Pulse 62   Ht 4\' 11"  (1.499 m)   Wt 133 lb (60.3 kg)   SpO2 95%   BMI 26.86 kg/m    No data found.   Wt Readings from Last 3 Encounters:  03/21/23 133 lb (60.3 kg)  02/19/23 133 lb 2.5 oz (60.4 kg)  02/14/16 137 lb (62.1 kg)     GEN:  Well nourished, well developed in no acute distress; looks younger than stated age HEENT: Normal NECK: No JVD; No carotid bruits CARDIAC: RRR, no murmurs, rubs, gallops RESPIRATORY:  Clear to auscultation without rales, wheezing or rhonchi  ABDOMEN: Soft,  non-tender, non-distended MUSCULOSKELETAL: No edema SKIN: Warm and dry NEUROLOGIC:  Alert and oriented PSYCHIATRIC:  Normal affect    ASSESSMENT AND PLAN   Chronic systolic heart failure, euvolemic -EF 30 to 35% on echo March 2024 -Significant dyssynchyrony on TTE related to LBBB.  -Given advanced age, recommend medical management.  -Continue current medications.  Check BMET today.  If kidney function potassium  are stable, will consider titrating Entresto/spironolactone. -At her age, we will defer on starting SGLT2 inhibitor and focus on maximizing her other medications. -Given heart failure zone sheet.  Discussed yellow zone and when to take Lasix.  Hypertension, well-controlled -Continue current medications. -Goal BP is <130/80.  Recommend DASH diet (high in vegetables, fruits, low-fat dairy products, whole grains, poultry, fish, and nuts and low in sweets, sugar-sweetened beverages, and red meats), salt restriction and increase physical activity.  Disposition - Follow-up in 2 months.  ADDENDA: With normal kidney function and potassium on blood work April 24, recommend starting spironolactone 12.5 mg daily.   Medication Adjustments/Labs and Tests Ordered: Current medicines are reviewed at length with the patient today.  Concerns regarding medicines are outlined above.  Orders Placed This Encounter  Procedures   Basic metabolic panel   No orders of the defined types were placed in this encounter.   Patient Instructions  Medication Instructions:  No Changes *If you need a refill on your cardiac medications before your next appointment, please call your pharmacy*   Lab Work: BMET Today If you have labs (blood work) drawn today and your tests are completely normal, you will receive your results only by: MyChart Message (if you have MyChart) OR A paper copy in the mail If you have any lab test that is abnormal or we need to change your treatment, we will call you to review  the results.   Testing/Procedures: No Testing   Follow-Up: At [facility], you and your health needs are our priority.  As part of our continuing mission to provide you with exceptional heart care, we have created designated Provider Care Teams.  These Care Teams include your primary Cardiologist (physician) and Advanced Practice Providers (APPs -  Physician Assistants and Nurse Practitioners) who all work together to provide you with the care you need, when you need it.  We recommend signing up for the patient portal called "MyChart".  Sign up information is provided on this After Visit Summary.  MyChart is used to connect with patients for Virtual Visits (Telemedicine).  Patients are able to view lab/test results, encounter notes, upcoming appointments, etc.  Non-urgent messages can be sent to your provider as well.   To learn more about what you can do with MyChart, go to [email or web address].    Your next appointment:   2 month(s)  Provider:   Juanda Crumble PA-C, or Raynelle Jan, MD   Signed, Cannon Kettle, PA-C  03/21/2023 11:01 AM    [facility]

## 2023-03-21 ENCOUNTER — Encounter: Payer: Self-pay | Admitting: Physician Assistant

## 2023-03-21 ENCOUNTER — Ambulatory Visit: Payer: Medicare HMO | Attending: Family | Admitting: Physician Assistant

## 2023-03-21 VITALS — BP 132/78 | HR 62 | Ht 59.0 in | Wt 133.0 lb

## 2023-03-21 DIAGNOSIS — I5022 Chronic systolic (congestive) heart failure: Secondary | ICD-10-CM

## 2023-03-21 DIAGNOSIS — I10 Essential (primary) hypertension: Secondary | ICD-10-CM

## 2023-03-21 DIAGNOSIS — E782 Mixed hyperlipidemia: Secondary | ICD-10-CM

## 2023-03-21 NOTE — Patient Instructions (Signed)
Medication Instructions:  No Changes *If you need a refill on your cardiac medications before your next appointment, please call your pharmacy*   Lab Work: BMET Today If you have labs (blood work) drawn today and your tests are completely normal, you will receive your results only by: MyChart Message (if you have MyChart) OR A paper copy in the mail If you have any lab test that is abnormal or we need to change your treatment, we will call you to review the results.   Testing/Procedures: No Testing   Follow-Up: At [facility], you and your health needs are our priority.  As part of our continuing mission to provide you with exceptional heart care, we have created designated Provider Care Teams.  These Care Teams include your primary Cardiologist (physician) and Advanced Practice Providers (APPs -  Physician Assistants and Nurse Practitioners) who all work together to provide you with the care you need, when you need it.  We recommend signing up for the patient portal called "MyChart".  Sign up information is provided on this After Visit Summary.  MyChart is used to connect with patients for Virtual Visits (Telemedicine).  Patients are able to view lab/test results, encounter notes, upcoming appointments, etc.  Non-urgent messages can be sent to your provider as well.   To learn more about what you can do with MyChart, go to [email or web address].    Your next appointment:   2 month(s)  Provider:   Juanda Crumble PA-C, or Raynelle Jan, MD

## 2023-03-22 LAB — BASIC METABOLIC PANEL
BUN/Creatinine Ratio: 21 (ref 12–28)
BUN: 15 mg/dL (ref 10–36)
CO2: 26 mmol/L (ref 20–29)
Calcium: 9.5 mg/dL (ref 8.7–10.3)
Chloride: 103 mmol/L (ref 96–106)
Creatinine, Ser: 0.72 mg/dL (ref 0.57–1.00)
Glucose: 106 mg/dL — ABNORMAL HIGH (ref 70–99)
Potassium: 4.3 mmol/L (ref 3.5–5.2)
Sodium: 143 mmol/L (ref 134–144)
eGFR: 77 mL/min/{1.73_m2} (ref >59)

## 2023-03-30 ENCOUNTER — Telehealth: Payer: Self-pay

## 2023-03-30 DIAGNOSIS — I5023 Acute on chronic systolic (congestive) heart failure: Secondary | ICD-10-CM

## 2023-03-30 MED ORDER — SPIRONOLACTONE 25 MG PO TABS: 12.5000 mg | ORAL_TABLET | Freq: Every day | ORAL | 0 refills | Status: DC

## 2023-03-30 NOTE — Telephone Encounter (Addendum)
Called patient regarding results. Patient had understanding of results. Patient advised of new prescription for Spironolactone 12.5 mg Daily. And repeat BMET in 1 week.----- Message from Cannon Kettle, PA-C sent at 03/27/2023  9:32 AM EDT ----- Kidney function and potassium are normal.   To maximize heart failure therapy, recommend starting spironolactone 12.5mg  daily & repeat blood work (BMET) in 1 week.    [name] - please send in prescription for spironolactone 12.5 mg daily, 90 days with 1 refill.

## 2023-04-10 LAB — BASIC METABOLIC PANEL
BUN/Creatinine Ratio: 21 (ref 12–28)
BUN: 17 mg/dL (ref 10–36)
CO2: 26 mmol/L (ref 20–29)
Calcium: 9.8 mg/dL (ref 8.7–10.3)
Chloride: 102 mmol/L (ref 96–106)
Creatinine, Ser: 0.81 mg/dL (ref 0.57–1.00)
Glucose: 119 mg/dL — ABNORMAL HIGH (ref 70–99)
Potassium: 4.5 mmol/L (ref 3.5–5.2)
Sodium: 141 mmol/L (ref 134–144)
eGFR: 67 mL/min/{1.73_m2} (ref >59)

## 2023-04-12 ENCOUNTER — Telehealth: Payer: Self-pay

## 2023-04-12 NOTE — Telephone Encounter (Addendum)
Called patient regarding results. Left message for patient to call office.----- Message from Cannon Kettle, PA-C sent at 04/10/2023 12:25 PM EDT ----- Normal kidney function and potassium.  Recommendations: --To maximize heart failure therapy --->  increase Entresto to 49/51mg  twice daily, #60 tablets, 3 refills. -- Check BMET again in 1 week, please let us know if your systolic blood pressure drops less than 110 or if you feel lightheaded/dizzy. Diagnosis -chronic systolic heart failure

## 2023-05-02 ENCOUNTER — Other Ambulatory Visit: Payer: Self-pay

## 2023-05-02 ENCOUNTER — Other Ambulatory Visit (HOSPITAL_COMMUNITY): Payer: Self-pay

## 2023-05-03 ENCOUNTER — Other Ambulatory Visit (HOSPITAL_COMMUNITY): Payer: Self-pay

## 2023-05-04 ENCOUNTER — Other Ambulatory Visit: Payer: Self-pay

## 2023-05-04 ENCOUNTER — Other Ambulatory Visit (HOSPITAL_COMMUNITY): Payer: Self-pay

## 2023-05-04 ENCOUNTER — Telehealth: Payer: Self-pay

## 2023-05-04 DIAGNOSIS — I5023 Acute on chronic systolic (congestive) heart failure: Secondary | ICD-10-CM

## 2023-05-04 MED ORDER — ENTRESTO 49-51 MG PO TABS: 1.0000 | ORAL_TABLET | Freq: Two times a day (BID) | ORAL | 3 refills | Status: DC

## 2023-05-04 NOTE — Telephone Encounter (Signed)
-----   Message from Cannon Kettle, PA-C sent at 04/10/2023 12:25 PM EDT ----- Normal kidney function and potassium.  Recommendations: --To maximize heart failure therapy --->  increase Entresto to 49/51mg  twice daily, #60 tablets, 3 refills. -- Check BMET again in 1 week, please let us know if your systolic blood pressure drops less than 110 or if you feel lightheaded/dizzy. Diagnosis -chronic systolic heart failure

## 2023-05-22 LAB — BASIC METABOLIC PANEL
BUN/Creatinine Ratio: 27 (ref 12–28)
BUN: 19 mg/dL (ref 10–36)
CO2: 29 mmol/L (ref 20–29)
Calcium: 9.2 mg/dL (ref 8.7–10.3)
Chloride: 105 mmol/L (ref 96–106)
Creatinine, Ser: 0.7 mg/dL (ref 0.57–1.00)
Glucose: 125 mg/dL — ABNORMAL HIGH (ref 70–99)
Potassium: 4.2 mmol/L (ref 3.5–5.2)
Sodium: 146 mmol/L — ABNORMAL HIGH (ref 134–144)
eGFR: 80 mL/min/{1.73_m2} (ref >59)

## 2023-05-22 NOTE — Progress Notes (Unsigned)
  Cardiology Office Note   Date:  05/23/2023  ID:  Mayleen, Borrero Nov 22, 1927, MRN [id number] PCP:  Delma Officer, PA [facility] Cardiologist: Jodelle Red, MD  Reason for visit: 2 month follow-up  History of Present Illness    Christina Green is a 89 y.o. female with a hx of hypertension, hyperlipidemia, depression, diabetes, LBBB.  Patient reported pneumonia diagnosis in November 2023 with persistent shortness of breath following that.   She is admitted to the hospital in late March 2024 for with 4 days of worsening dyspnea.  She was in respiratory distress with O2 saturations in the 80s.  Placed on BiPAP.  Chest x-ray showed vascular congestion and small bilateral pleural effusions.  She was diuresed with Lasix.  Echo showed EF of 30 to 35% with inferior wall and apex akinesis and anterior and lateral wall hypokinesis.  No significant valve disease.  As she responded to therapy, ischemic evaluation was deferred after shared decision making between patient and cardiology.  She was treated with metoprolol, Entresto, Lasix and spironolactone.  I saw her in April 2024.  Her breathing improved.  Her weight was stable at 126-127 pounds.  Euvolemic on exam.  With stable kidney function/potassium, Entresto was increased on 6/7.  Today, she feels well.  She states home weight stays around 126 pounds.  She denies shortness of breath, PND, orthopnea and leg swelling.  She has not needed to take Lasix.  She denies chest pain, lightheadedness and syncope.  She requests refill of Entresto to [facility].  She is planning to go on a [place] cruise at the end of August with her grandchildren.   Objective / Physical Exam   Vital signs:  BP 134/72   Pulse 62   Wt 132 lb (59.9 kg)   SpO2 97%   BMI 26.66 kg/m     GEN: No acute distress NECK: No carotid bruits CARDIAC: RRR, no murmurs RESPIRATORY:  Clear to auscultation without rales, wheezing or rhonchi  EXTREMITIES: No  edema  Assessment and Plan   Chronic systolic heart failure, euvolemic -EF 30 to 35% on echo March 2024 -Significant dyssynchyrony on TTE related to LBBB.  -Given advanced age, recommend medical management.  -Since she is tolerating it well and Cr & K are within normal limits, refilled Entresto 49/51 mg twice daily to [facility]. -Continue Toprol XL 50 mg once daily.  Heart rate 62 today.  -Continue spironolactone 12.5 mg daily. -At her age, we will defer on starting SGLT2 inhibitor and focus on maximizing her other medications. -Advised her to ensure she brings all of her medications with her on her vacation.  Monitor for increased swelling.  Continue Lasix 20 mg as needed for leg swelling or weight gain of 3 pounds in a day or 5 pounds in a week. -Recheck 2D echo in 2 to 3 months to see if EF improved post medical therapy.   Hypertension, well-controlled -Continue meds above.   Disposition - Follow-up in 3 months after 2D echo.     Signed, Cannon Kettle, PA-C  05/23/2023 [facility]

## 2023-05-23 ENCOUNTER — Ambulatory Visit: Payer: Medicare HMO | Attending: Physician Assistant | Admitting: Physician Assistant

## 2023-05-23 ENCOUNTER — Encounter: Payer: Self-pay | Admitting: Physician Assistant

## 2023-05-23 ENCOUNTER — Telehealth: Payer: Self-pay

## 2023-05-23 VITALS — BP 134/72 | HR 62 | Wt 132.0 lb

## 2023-05-23 DIAGNOSIS — I5022 Chronic systolic (congestive) heart failure: Secondary | ICD-10-CM

## 2023-05-23 DIAGNOSIS — I10 Essential (primary) hypertension: Secondary | ICD-10-CM | POA: Diagnosis not present

## 2023-05-23 MED ORDER — ENTRESTO 49-51 MG PO TABS: 1.0000 | ORAL_TABLET | Freq: Two times a day (BID) | ORAL | 3 refills | Status: DC

## 2023-05-23 NOTE — Telephone Encounter (Addendum)
Results reviewed with Rito Ehrlich at office visit on 05/23/23 ----- Message from Cannon Kettle, PA-C sent at 05/22/2023 12:33 PM EDT ----- Normal kidney function and potassium.   Continue current medications.

## 2023-05-23 NOTE — Patient Instructions (Signed)
Medication Instructions:  No Changes *If you need a refill on your cardiac medications before your next appointment, please call your pharmacy*   Lab Work: No labs If you have labs (blood work) drawn today and your tests are completely normal, you will receive your results only by: MyChart Message (if you have MyChart) OR A paper copy in the mail If you have any lab test that is abnormal or we need to change your treatment, we will call you to review the results.   Testing/Procedures: [address]. ( September 2024). Your physician has requested that you have an echocardiogram. Echocardiography is a painless test that uses sound waves to create images of your heart. It provides your doctor with information about the size and shape of your heart and how well your heart's chambers and valves are working. This procedure takes approximately one hour. There are no restrictions for this procedure. Please do NOT wear cologne, perfume, aftershave, or lotions (deodorant is allowed). Please arrive 15 minutes prior to your appointment time.    Follow-Up: At [facility], you and your health needs are our priority.  As part of our continuing mission to provide you with exceptional heart care, we have created designated Provider Care Teams.  These Care Teams include your primary Cardiologist (physician) and Advanced Practice Providers (APPs -  Physician Assistants and Nurse Practitioners) who all work together to provide you with the care you need, when you need it.  We recommend signing up for the patient portal called "MyChart".  Sign up information is provided on this After Visit Summary.  MyChart is used to connect with patients for Virtual Visits (Telemedicine).  Patients are able to view lab/test results, encounter notes, upcoming appointments, etc.  Non-urgent messages can be sent to your provider as well.   To learn more about what you can do with MyChart, go to  [email or web address].    Your next appointment:   3 month(s) (Post Echo)  Provider:   Juanda Crumble, PA-C

## 2023-06-18 ENCOUNTER — Other Ambulatory Visit: Payer: Self-pay | Admitting: *Deleted

## 2023-06-18 MED ORDER — SPIRONOLACTONE 25 MG PO TABS: 12.5000 mg | ORAL_TABLET | Freq: Every day | ORAL | 2 refills | Status: DC

## 2023-06-18 NOTE — Telephone Encounter (Signed)
Medication original  started on 03/30/23   prescribed by Juanda Crumble PA

## 2023-08-15 ENCOUNTER — Ambulatory Visit (HOSPITAL_COMMUNITY): Payer: Medicare HMO | Attending: Physician Assistant

## 2023-08-15 DIAGNOSIS — I5022 Chronic systolic (congestive) heart failure: Secondary | ICD-10-CM | POA: Insufficient documentation

## 2023-08-15 DIAGNOSIS — I10 Essential (primary) hypertension: Secondary | ICD-10-CM | POA: Diagnosis not present

## 2023-08-15 LAB — ECHOCARDIOGRAM COMPLETE
Area-P 1/2: 5.23 cm2
P 1/2 time: 390 ms
S' Lateral: 3.53 cm

## 2023-08-21 NOTE — Progress Notes (Unsigned)
Cardiology Office Note   Date:  08/22/2023  ID:  Heatherly, Wees 1927-07-10, MRN [id number] PCP:  Delma Officer, PA [facility] Cardiologist: Parke Poisson, MD  Reason for visit: 3 month follow-up  History of Present Illness    Christina Green is a 89 y.o. female with a hx of hypertension, hyperlipidemia, depression, diabetes, LBBB. Patient reported pneumonia diagnosis in November 2023 with persistent shortness of breath thereafter.  She was admitted in March 2024 with respiratory distress with O2 saturations in the 80s, treated with BiPAP and Lasix.  Echo showed EF of 30 to 35% with inferior wall and apex akinesis and anterior and lateral wall hypokinesis. As she responded to therapy, ischemic evaluation was deferred after shared decision making between patient and cardiology. She was treated with metoprolol, Entresto, Lasix and spironolactone.   I last saw her in June 2024.  She denies CHF symptoms and dry weight around 126 pounds.    She went on a [place] cruise at the end of August with her grandchildren & did well.  Today she denies chest pain, shortness of breath, PND, orthopnea, lower extreme edema, palpitations and significant lightheadedness.  She states weights have been stable.  She has not needed to use Lasix.  When reviewing her medication list, she states she has not been taking aspirin.  She mentions that her [facility] mail order is no longer providing Entresto -she asked this to be sent to local pharmacy.  Her son has to cut spironolactone in half given her arthritis.  Objective / Physical Exam   Vital signs:  BP 122/62   Pulse 88   Ht 4\' 11"  (1.499 m)   Wt 131 lb (59.4 kg)   SpO2 91%   BMI 26.46 kg/m     GEN: No acute distress NECK: No carotid bruits CARDIAC: RRR, no murmurs RESPIRATORY:  Clear to auscultation without rales, wheezing or rhonchi  EXTREMITIES: No edema  Assessment and Plan   Chronic systolic heart failure,  euvolemic Elevated troponin LBBB -EF 30 to 35% on echo March 2024 -Significant dyssynchyrony on TTE related to LBBB.  -Given advanced age, recommend medical management.  -As she responded to therapy, ischemic evaluation was deferred -Repeat echo 08/15/23: EF 25-30%, technically difficult study, inferior hypokinesis, mild MR, mild to moderate AI -Refilled Entresto 49/51 mg twice daily to [facility]. -Continue Toprol XL 50 mg once daily.    -Can increase spironolactone to 25 mg once daily.  Check BMET in 1 week.   -At her age, we will defer on starting SGLT2 inhibitor. -With elevated troponin in March, do recommend she take aspirin 81 mg daily.   Hypertension, well-controlled -Medications as above.   Disposition - Follow-up in 4 months with Dr. Jacques Navy - she has now established a regular cardiologist in the office yet.  Signed, Cannon Kettle, PA-C  08/22/2023 [facility]

## 2023-08-22 ENCOUNTER — Encounter: Payer: Self-pay | Admitting: Physician Assistant

## 2023-08-22 ENCOUNTER — Ambulatory Visit: Payer: Medicare HMO | Attending: Physician Assistant | Admitting: Physician Assistant

## 2023-08-22 VITALS — BP 122/62 | HR 88 | Ht 59.0 in | Wt 131.0 lb

## 2023-08-22 DIAGNOSIS — I5022 Chronic systolic (congestive) heart failure: Secondary | ICD-10-CM

## 2023-08-22 DIAGNOSIS — I10 Essential (primary) hypertension: Secondary | ICD-10-CM

## 2023-08-22 DIAGNOSIS — R7989 Other specified abnormal findings of blood chemistry: Secondary | ICD-10-CM | POA: Diagnosis not present

## 2023-08-22 DIAGNOSIS — I447 Left bundle-branch block, unspecified: Secondary | ICD-10-CM | POA: Diagnosis not present

## 2023-08-22 MED ORDER — ENTRESTO 49-51 MG PO TABS: 1.0000 | ORAL_TABLET | Freq: Two times a day (BID) | ORAL | 3 refills | Status: DC

## 2023-08-22 MED ORDER — SPIRONOLACTONE 25 MG PO TABS: 25.0000 mg | ORAL_TABLET | Freq: Every day | ORAL | 3 refills | Status: DC

## 2023-08-22 NOTE — Patient Instructions (Signed)
Medication Instructions:  Take Aspirin 81 mg ( Take 1 Tablet Daily). Increase Spironolactone from 12.5 mg to 25 mg ( Take 1 Tablet Daily). *If you need a refill on your cardiac medications before your next appointment, please call your pharmacy*   Lab Work: BMET:  To Be Done in 1 week. If you have labs (blood work) drawn today and your tests are completely normal, you will receive your results only by: MyChart Message (if you have MyChart) OR A paper copy in the mail If you have any lab test that is abnormal or we need to change your treatment, we will call you to review the results.   Testing/Procedures: No Testing   Follow-Up: At [facility], you and your health needs are our priority.  As part of our continuing mission to provide you with exceptional heart care, we have created designated Provider Care Teams.  These Care Teams include your primary Cardiologist (physician) and Advanced Practice Providers (APPs -  Physician Assistants and Nurse Practitioners) who all work together to provide you with the care you need, when you need it.  We recommend signing up for the patient portal called "MyChart".  Sign up information is provided on this After Visit Summary.  MyChart is used to connect with patients for Virtual Visits (Telemedicine).  Patients are able to view lab/test results, encounter notes, upcoming appointments, etc.  Non-urgent messages can be sent to your provider as well.   To learn more about what you can do with MyChart, go to [email or web address].    Your next appointment:   4 month(s)  Provider:   Weston Brass, MD

## 2023-08-24 ENCOUNTER — Telehealth: Payer: Self-pay

## 2023-08-24 NOTE — Telephone Encounter (Addendum)
Called patient regarding results. Patient had understanding of results.----- Message from Christina Green sent at 08/21/2023  4:55 PM EDT ----- Heart squeeze essentially unchanged from last echocardiogram, EF around 30%. Has appointment tomorrow to reassess symptoms and management.

## 2023-08-29 LAB — BASIC METABOLIC PANEL
BUN/Creatinine Ratio: 25 (ref 12–28)
BUN: 22 mg/dL (ref 10–36)
CO2: 26 mmol/L (ref 20–29)
Calcium: 9.5 mg/dL (ref 8.7–10.3)
Chloride: 99 mmol/L (ref 96–106)
Creatinine, Ser: 0.87 mg/dL (ref 0.57–1.00)
Glucose: 108 mg/dL — ABNORMAL HIGH (ref 70–99)
Potassium: 4.8 mmol/L (ref 3.5–5.2)
Sodium: 141 mmol/L (ref 134–144)
eGFR: 62 mL/min/{1.73_m2} (ref >59)

## 2023-08-30 ENCOUNTER — Telehealth: Payer: Self-pay

## 2023-08-30 NOTE — Telephone Encounter (Addendum)
Called patient regarding results. Patient had understanding of results.----- Message from Cannon Kettle sent at 08/29/2023 12:31 PM EDT ----- Normal kidney function and electrolytes.  Continue current medications.

## 2023-09-02 ENCOUNTER — Other Ambulatory Visit (HOSPITAL_COMMUNITY): Payer: Self-pay

## 2023-12-20 ENCOUNTER — Ambulatory Visit: Payer: Medicare HMO | Admitting: Internal Medicine

## 2023-12-27 ENCOUNTER — Ambulatory Visit: Payer: Medicare HMO | Attending: Cardiology | Admitting: Cardiology

## 2023-12-27 ENCOUNTER — Encounter: Payer: Self-pay | Admitting: Cardiology

## 2023-12-27 VITALS — BP 142/80 | HR 74 | Ht 59.0 in | Wt 140.0 lb

## 2023-12-27 DIAGNOSIS — I10 Essential (primary) hypertension: Secondary | ICD-10-CM | POA: Diagnosis not present

## 2023-12-27 DIAGNOSIS — I420 Dilated cardiomyopathy: Secondary | ICD-10-CM

## 2023-12-27 DIAGNOSIS — I5022 Chronic systolic (congestive) heart failure: Secondary | ICD-10-CM | POA: Diagnosis not present

## 2023-12-27 DIAGNOSIS — E782 Mixed hyperlipidemia: Secondary | ICD-10-CM

## 2023-12-27 DIAGNOSIS — E785 Hyperlipidemia, unspecified: Secondary | ICD-10-CM

## 2023-12-27 DIAGNOSIS — E1169 Type 2 diabetes mellitus with other specified complication: Secondary | ICD-10-CM

## 2023-12-27 NOTE — Patient Instructions (Signed)
Medication Instructions:  Your physician recommends that you continue on your current medications as directed. Please refer to the Current Medication list given to you today.  *If you need a refill on your cardiac medications before your next appointment, please call your pharmacy*  Testing/Procedures: Your physician has requested that you have an echocardiogram. Echocardiography is a painless test that uses sound waves to create images of your heart. It provides your doctor with information about the size and shape of your heart and how well your heart's chambers and valves are working. This procedure takes approximately one hour. There are no restrictions for this procedure. Please do NOT wear cologne, perfume, aftershave, or lotions (deodorant is allowed). Please arrive 15 minutes prior to your appointment time.  Please note: We ask at that you not bring children with you during ultrasound (echo/ vascular) testing. Due to room size and safety concerns, children are not allowed in the ultrasound rooms during exams. Our front office staff cannot provide observation of children in our lobby area while testing is being conducted. An adult accompanying a patient to their appointment will only be allowed in the ultrasound room at the discretion of the ultrasound technician under special circumstances. We apologize for any inconvenience.    Follow-Up: At [facility] you and your health needs are our priority.  As part of our continuing mission to provide you with exceptional heart care, we have created designated Provider Care Teams.  These Care Teams include your primary Cardiologist (physician) and Advanced Practice Providers (APPs -  Physician Assistants and Nurse Practitioners) who all work together to provide you with the care you need, when you need it.   Your next appointment:   4 month(s)  Provider:   Thomasene Ripple, DO     Other Instructions:

## 2023-12-27 NOTE — Progress Notes (Signed)
Cardiology Office Note:    Date:  12/29/2023   ID:  Christina Green, DOB 1927/06/14, MRN [id number]  PCP:  Delma Officer, PA  Cardiologist:  Thomasene Ripple, DO  Electrophysiologist:  None   Referring MD: Delma Officer, PA   " I am ok"  History of Present Illness:    Christina Green is a 89 y.o. female with a hx of chronic systolic heart failure, Dilated cardiomyopathy with EF 25-30%, Mild to moderate AR,  benign adrenal adenoma, depression, Graves' disease, hypertension, hyperlipidemia, psoriasis.  Presents for follow-up visit.   She  report feeling 'really good' and deny recent use of Lasix. She lives  alone, but close to their daughter and son-in-law, and are still able to navigate the stairs in their two-story home. She reports  a recent fall at their grandson's house, but deny any broken bones. They have been managing the pain with Tylenol. she also reports a history of allergies.  Past Medical History:  Diagnosis Date   Adrenal adenoma    benign   Depression    takes Cymbalta daily   Diarrhea    "often" takes Immodium as needed   DJD (degenerative joint disease) of knee    and hands   Graves disease    Headache    nightly   History of kidney stones    History of shingles    Hyperlipemia    takes Atorvastatin daily   Hypertension    takes Hyzaar and Labetalol daily   Impaired hearing    wears hearing aides   Joint pain    Joint swelling    Left bundle branch block (LBBB)    identified in 2011   Psoriasis     Past Surgical History:  Procedure Laterality Date   CARDIAC CATHETERIZATION     CATARACT EXTRACTION     CHOLECYSTECTOMY     colonscopy     KNEE ARTHROSCOPY W/ MENISCAL REPAIR Right    REPLACEMENT TOTAL KNEE Left    TOTAL KNEE ARTHROPLASTY Right 01/31/2016   Procedure: RIGHT TOTAL KNEE ARTHROPLASTY;  Surgeon: Jodi Geralds, MD;  Location: [facility] OR;  Service: Orthopedics;  Laterality: Right;    Current Medications: Current Meds  Medication Sig    aspirin EC 81 MG tablet Take 81 mg by mouth daily. Take 1 Tablet Daily. Swallow whole.   DULoxetine (CYMBALTA) 60 MG capsule Take 60 mg by mouth daily. For depression   furosemide (LASIX) 20 MG tablet Take 1 tablet (20 mg total) by mouth daily as needed for edema or fluid (take in case of weight gain 2 to 3 lbs in 24 hrs or 5 lbs in 7 days.).   Multiple Vitamins-Minerals (PRESERVISION AREDS PO) Take 1 capsule by mouth in the morning and at bedtime.   sacubitril-valsartan (ENTRESTO) 49-51 MG Take 1 tablet by mouth 2 (two) times daily.     Allergies:   Penicillins   Social History   Socioeconomic History   Marital status: Widowed    Spouse name: Not on file   Number of children: Not on file   Years of education: Not on file   Highest education level: Not on file  Occupational History   Not on file  Tobacco Use   Smoking status: Never   Smokeless tobacco: Not on file  Substance and Sexual Activity   Alcohol use: Yes    Comment: wine occasionally   Drug use: No   Sexual activity: Not on file  Other Topics Concern  Not on file  Social History Narrative   Not on file   Social Drivers of Health   Financial Resource Strain: Not on file  Food Insecurity: Not on file  Transportation Needs: Not on file  Physical Activity: Not on file  Stress: Not on file  Social Connections: Not on file     Family History: The patient's family history includes CVA in her sister; Heart disease in her father and mother; Hypertension in her father, mother, and sister.  ROS:   Review of Systems  Constitution: Negative for decreased appetite, fever and weight gain.  HENT: Negative for congestion, ear discharge, hoarse voice and sore throat.   Eyes: Negative for discharge, redness, vision loss in right eye and visual halos.  Cardiovascular: Negative for chest pain, dyspnea on exertion, leg swelling, orthopnea and palpitations.  Respiratory: Negative for cough, hemoptysis, shortness of breath and  snoring.   Endocrine: Negative for heat intolerance and polyphagia.  Hematologic/Lymphatic: Negative for bleeding problem. Does not bruise/bleed easily.  Skin: Negative for flushing, nail changes, rash and suspicious lesions.  Musculoskeletal: Negative for arthritis, joint pain, muscle cramps, myalgias, neck pain and stiffness.  Gastrointestinal: Negative for abdominal pain, bowel incontinence, diarrhea and excessive appetite.  Genitourinary: Negative for decreased libido, genital sores and incomplete emptying.  Neurological: Negative for brief paralysis, focal weakness, headaches and loss of balance.  Psychiatric/Behavioral: Negative for altered mental status, depression and suicidal ideas.  Allergic/Immunologic: Negative for HIV exposure and persistent infections.    EKGs/Labs/Other Studies Reviewed:    The following studies were reviewed today:   EKG:  The ekg ordered today demonstrates   Recent Labs: 02/14/2023: ALT 26; B Natriuretic Peptide 1,073.4 02/17/2023: Hemoglobin 13.5; Platelets 306 02/18/2023: Magnesium 2.3 08/28/2023: BUN 22; Creatinine, Ser 0.87; Potassium 4.8; Sodium 141  Recent Lipid Panel    Component Value Date/Time   CHOL 265 (H) 02/18/2023 0049   TRIG 228 (H) 02/18/2023 0049   HDL 44 02/18/2023 0049   CHOLHDL 6.0 02/18/2023 0049   VLDL 46 (H) 02/18/2023 0049   LDLCALC 175 (H) 02/18/2023 0049    Physical Exam:    VS:  BP (!) 142/80   Pulse 74   Ht 4\' 11"  (1.499 m)   Wt 140 lb (63.5 kg)   SpO2 94%   BMI 28.28 kg/m     Wt Readings from Last 3 Encounters:  12/27/23 140 lb (63.5 kg)  08/22/23 131 lb (59.4 kg)  05/23/23 132 lb (59.9 kg)     GEN: Well nourished, well developed in no acute distress HEENT: Normal NECK: No JVD; No carotid bruits LYMPHATICS: No lymphadenopathy CARDIAC: S1S2 noted,RRR, no murmurs, rubs, gallops RESPIRATORY:  Clear to auscultation without rales, wheezing or rhonchi  ABDOMEN: Soft, non-tender, non-distended, +bowel sounds,  no guarding. EXTREMITIES: No edema, No cyanosis, no clubbing MUSCULOSKELETAL:  No deformity  SKIN: Warm and dry NEUROLOGIC:  Alert and oriented x 3, non-focal PSYCHIATRIC:  Normal affect, good insight  ASSESSMENT:    1. Chronic systolic CHF (congestive heart failure) (HCC)   2. HTN (hypertension), benign   3. Cardiomyopathy, dilated, nonischemic (HCC)   4. Mixed hyperlipidemia   5. Type 2 diabetes mellitus with hyperlipidemia (HCC)    PLAN:    Chronic systolic heart failure - euvolemic. Continue current medication  regimen. She needs repeat echo to assess her LV function.   HLN - cont her statin   Hypertension - blood pressure SBP in the 140s - shared decision no changes in antihypertensive  The  patient is in agreement with the above plan. The patient left the office in stable condition.  The patient will follow up in   Medication Adjustments/Labs and Tests Ordered: Current medicines are reviewed at length with the patient today.  Concerns regarding medicines are outlined above.  Orders Placed This Encounter  Procedures   EKG 12-Lead   ECHOCARDIOGRAM COMPLETE   No orders of the defined types were placed in this encounter.   Patient Instructions  Medication Instructions:  Your physician recommends that you continue on your current medications as directed. Please refer to the Current Medication list given to you today.  *If you need a refill on your cardiac medications before your next appointment, please call your pharmacy*  Testing/Procedures: Your physician has requested that you have an echocardiogram. Echocardiography is a painless test that uses sound waves to create images of your heart. It provides your doctor with information about the size and shape of your heart and how well your heart's chambers and valves are working. This procedure takes approximately one hour. There are no restrictions for this procedure. Please do NOT wear cologne, perfume, aftershave, or  lotions (deodorant is allowed). Please arrive 15 minutes prior to your appointment time.  Please note: We ask at that you not bring children with you during ultrasound (echo/ vascular) testing. Due to room size and safety concerns, children are not allowed in the ultrasound rooms during exams. Our front office staff cannot provide observation of children in our lobby area while testing is being conducted. An adult accompanying a patient to their appointment will only be allowed in the ultrasound room at the discretion of the ultrasound technician under special circumstances. We apologize for any inconvenience.    Follow-Up: At [facility], you and your health needs are our priority.  As part of our continuing mission to provide you with exceptional heart care, we have created designated Provider Care Teams.  These Care Teams include your primary Cardiologist (physician) and Advanced Practice Providers (APPs -  Physician Assistants and Nurse Practitioners) who all work together to provide you with the care you need, when you need it.   Your next appointment:   4 month(s)  Provider:   Thomasene Ripple, DO     Other Instructions:     Adopting a Healthy Lifestyle.  Know what a healthy weight is for you (roughly BMI <25) and aim to maintain this   Aim for 7+ servings of fruits and vegetables daily   65-80+ fluid ounces of water or unsweet tea for healthy kidneys   Limit to max 1 drink of alcohol per day; avoid smoking/tobacco   Limit animal fats in diet for cholesterol and heart health - choose grass fed whenever available   Avoid highly processed foods, and foods high in saturated/trans fats   Aim for low stress - take time to unwind and care for your mental health   Aim for 150 min of moderate intensity exercise weekly for heart health, and weights twice weekly for bone health   Aim for 7-9 hours of sleep daily   When it comes to diets, agreement about the perfect plan isnt  easy to find, even among the experts. Experts at the [facility] [employer] developed an idea known as the Healthy Eating Plate. Just imagine a plate divided into logical, healthy portions.   The emphasis is on diet quality:   Load up on vegetables and fruits - one-half of your plate: Aim for color and  variety, and remember that potatoes dont count.   Go for whole grains - one-quarter of your plate: Whole wheat, barley, wheat berries, quinoa, oats, brown rice, and foods made with them. If you want pasta, go with whole wheat pasta.   Protein power - one-quarter of your plate: Fish, chicken, beans, and nuts are all healthy, versatile protein sources. Limit red meat.   The diet, however, does go beyond the plate, offering a few other suggestions.   Use healthy plant oils, such as olive, canola, soy, corn, sunflower and peanut. Check the labels, and avoid partially hydrogenated oil, which have unhealthy trans fats.   If youre thirsty, drink water. Coffee and tea are good in moderation, but skip sugary drinks and limit milk and dairy products to one or two daily servings.   The type of carbohydrate in the diet is more important than the amount. Some sources of carbohydrates, such as vegetables, fruits, whole grains, and beans-are healthier than others.   Finally, stay active  Signed, Thomasene Ripple, DO  12/29/2023 9:23 AM    [facility]

## 2024-01-14 ENCOUNTER — Ambulatory Visit (HOSPITAL_COMMUNITY): Payer: Medicare HMO | Attending: Cardiology

## 2024-01-14 DIAGNOSIS — I420 Dilated cardiomyopathy: Secondary | ICD-10-CM | POA: Insufficient documentation

## 2024-01-14 LAB — ECHOCARDIOGRAM COMPLETE
Area-P 1/2: 5.46 cm2
P 1/2 time: 501 ms
S' Lateral: 4.2 cm

## 2024-04-23 ENCOUNTER — Ambulatory Visit: Payer: Medicare HMO | Attending: Cardiology | Admitting: Cardiology

## 2024-04-23 ENCOUNTER — Encounter: Payer: Self-pay | Admitting: Cardiology

## 2024-04-23 VITALS — BP 120/64 | HR 89 | Ht <= 58 in | Wt 131.8 lb

## 2024-04-23 DIAGNOSIS — R7989 Other specified abnormal findings of blood chemistry: Secondary | ICD-10-CM | POA: Diagnosis not present

## 2024-04-23 DIAGNOSIS — I5022 Chronic systolic (congestive) heart failure: Secondary | ICD-10-CM | POA: Diagnosis not present

## 2024-04-23 DIAGNOSIS — E782 Mixed hyperlipidemia: Secondary | ICD-10-CM

## 2024-04-23 DIAGNOSIS — I10 Essential (primary) hypertension: Secondary | ICD-10-CM

## 2024-04-23 DIAGNOSIS — I420 Dilated cardiomyopathy: Secondary | ICD-10-CM | POA: Diagnosis not present

## 2024-04-23 DIAGNOSIS — E785 Hyperlipidemia, unspecified: Secondary | ICD-10-CM

## 2024-04-23 DIAGNOSIS — E1169 Type 2 diabetes mellitus with other specified complication: Secondary | ICD-10-CM

## 2024-04-23 NOTE — Patient Instructions (Signed)
   Testing/Procedures:  Your physician has requested that you have an echocardiogram. Echocardiography is a painless test that uses sound waves to create images of your heart. It provides your doctor with information about the size and shape of your heart and how well your heart's chambers and valves are working. This procedure takes approximately one hour. There are no restrictions for this procedure. Please do NOT wear cologne, perfume, aftershave, or lotions (deodorant is allowed). Please arrive 15 minutes prior to your appointment time.  Please note: We ask at that you not bring children with you during ultrasound (echo/ vascular) testing. Due to room size and safety concerns, children are not allowed in the ultrasound rooms during exams. Our front office staff cannot provide observation of children in our lobby area while testing is being conducted. An adult accompanying a patient to their appointment will only be allowed in the ultrasound room at the discretion of the ultrasound technician under special circumstances. We apologize for any inconvenience. [address] IN AUGUST  Follow-Up: At [facility], you and your health needs are our priority.  As part of our continuing mission to provide you with exceptional heart care, our providers are all part of one team.  This team includes your primary Cardiologist (physician) and Advanced Practice Providers or APPs (Physician Assistants and Nurse Practitioners) who all work together to provide you with the care you need, when you need it.  Your next appointment:   6 month(s)  Provider:   Kardie Tobb, DO

## 2024-05-02 NOTE — Progress Notes (Signed)
 Cardiology Office Note:    Date:  05/02/2024   ID:  Christina Green, DOB 17-Mar-1927, MRN [id number]  PCP:  Karalee Oscar, PA  Cardiologist:  Jerryl Morin, DO  Electrophysiologist:  None   Referring MD: Karalee Oscar, PA   " I am ok"   History of Present Illness:    Christina Green is a 89 y.o. female with a hx of chronic systolic heart failure, Dilated cardiomyopathy with EF 25-30%, Mild to moderate AR,  benign adrenal adenoma, depression, Graves' disease, hypertension, hyperlipidemia, psoriasis.  Presents for follow-up visit.   She experiences fatigue and attributes it to her medication, metoprolol , which she feels may slow her down. Despite this, she manages her activities without rushing. Her vitamin D levels were low at 11 in March, and there is a plan to repeat the level to assess the need for supplementation.  No chest pain, shortness of breath or leg swelling   Past Medical History:  Diagnosis Date   Adrenal adenoma    benign   Depression    takes Cymbalta  daily   Diarrhea    "often" takes Immodium as needed   DJD (degenerative joint disease) of knee    and hands   Graves disease    Headache    nightly   History of kidney stones    History of shingles    Hyperlipemia    takes Atorvastatin  daily   Hypertension    takes Hyzaar and Labetalol  daily   Impaired hearing    wears hearing aides   Joint pain    Joint swelling    Left bundle branch block (LBBB)    identified in 2011   Psoriasis     Past Surgical History:  Procedure Laterality Date   CARDIAC CATHETERIZATION     CATARACT EXTRACTION     CHOLECYSTECTOMY     colonscopy     KNEE ARTHROSCOPY W/ MENISCAL REPAIR Right    REPLACEMENT TOTAL KNEE Left    TOTAL KNEE ARTHROPLASTY Right 01/31/2016   Procedure: RIGHT TOTAL KNEE ARTHROPLASTY;  Surgeon: Neil Balls, MD;  Location: [facility] OR;  Service: Orthopedics;  Laterality: Right;    Current Medications: Current Meds  Medication Sig   aspirin  EC 81 MG  tablet Take 81 mg by mouth daily. Take 1 Tablet Daily. Swallow whole.   DULoxetine  (CYMBALTA ) 60 MG capsule Take 60 mg by mouth daily. For depression   furosemide  (LASIX ) 20 MG tablet Take 1 tablet (20 mg total) by mouth daily as needed for edema or fluid (take in case of weight gain 2 to 3 lbs in 24 hrs or 5 lbs in 7 days.).   Multiple Vitamins-Minerals (PRESERVISION AREDS PO) Take 1 capsule by mouth in the morning and at bedtime.   sacubitril -valsartan  (ENTRESTO ) 49-51 MG Take 1 tablet by mouth 2 (two) times daily.     Allergies:   Penicillins   Social History   Socioeconomic History   Marital status: Widowed    Spouse name: Not on file   Number of children: Not on file   Years of education: Not on file   Highest education level: Not on file  Occupational History   Not on file  Tobacco Use   Smoking status: Never   Smokeless tobacco: Not on file  Substance and Sexual Activity   Alcohol use: Yes    Comment: wine occasionally   Drug use: No   Sexual activity: Not on file  Other Topics Concern   Not  on file  Social History Narrative   Not on file   Social Drivers of Health   Financial Resource Strain: Not on file  Food Insecurity: Not on file  Transportation Needs: Not on file  Physical Activity: Not on file  Stress: Not on file  Social Connections: Not on file     Family History: The patient's family history includes CVA in her sister; Heart disease in her father and mother; Hypertension in her father, mother, and sister.  ROS:   Review of Systems  Constitution: Negative for decreased appetite, fever and weight gain.  HENT: Negative for congestion, ear discharge, hoarse voice and sore throat.   Eyes: Negative for discharge, redness, vision loss in right eye and visual halos.  Cardiovascular: Negative for chest pain, dyspnea on exertion, leg swelling, orthopnea and palpitations.  Respiratory: Negative for cough, hemoptysis, shortness of breath and snoring.    Endocrine: Negative for heat intolerance and polyphagia.  Hematologic/Lymphatic: Negative for bleeding problem. Does not bruise/bleed easily.  Skin: Negative for flushing, nail changes, rash and suspicious lesions.  Musculoskeletal: Negative for arthritis, joint pain, muscle cramps, myalgias, neck pain and stiffness.  Gastrointestinal: Negative for abdominal pain, bowel incontinence, diarrhea and excessive appetite.  Genitourinary: Negative for decreased libido, genital sores and incomplete emptying.  Neurological: Negative for brief paralysis, focal weakness, headaches and loss of balance.  Psychiatric/Behavioral: Negative for altered mental status, depression and suicidal ideas.  Allergic/Immunologic: Negative for HIV exposure and persistent infections.    EKGs/Labs/Other Studies Reviewed:    The following studies were reviewed today:   EKG:  The ekg ordered today demonstrates   Recent Labs: 08/28/2023: BUN 22; Creatinine, Ser 0.87; Potassium 4.8; Sodium 141  Recent Lipid Panel    Component Value Date/Time   CHOL 265 (H) 02/18/2023 0049   TRIG 228 (H) 02/18/2023 0049   HDL 44 02/18/2023 0049   CHOLHDL 6.0 02/18/2023 0049   VLDL 46 (H) 02/18/2023 0049   LDLCALC 175 (H) 02/18/2023 0049    Physical Exam:    VS:  BP 120/64 (BP Location: Left Arm, Patient Position: Sitting, Cuff Size: Normal)   Pulse 89   Ht 4\' 9"  (1.448 m)   Wt 131 lb 12.8 oz (59.8 kg)   SpO2 95%   BMI 28.52 kg/m     Wt Readings from Last 3 Encounters:  04/23/24 131 lb 12.8 oz (59.8 kg)  12/27/23 140 lb (63.5 kg)  08/22/23 131 lb (59.4 kg)     GEN: Well nourished, well developed in no acute distress HEENT: Normal NECK: No JVD; No carotid bruits LYMPHATICS: No lymphadenopathy CARDIAC: S1S2 noted,RRR, no murmurs, rubs, gallops RESPIRATORY:  Clear to auscultation without rales, wheezing or rhonchi  ABDOMEN: Soft, non-tender, non-distended, +bowel sounds, no guarding. EXTREMITIES: No edema, No  cyanosis, no clubbing MUSCULOSKELETAL:  No deformity  SKIN: Warm and dry NEUROLOGIC:  Alert and oriented x 3, non-focal PSYCHIATRIC:  Normal affect, good insight  ASSESSMENT:    1. Chronic systolic CHF (congestive heart failure) (HCC)   2. Cardiomyopathy, dilated, nonischemic (HCC)   3. Low vitamin D level   4. HTN (hypertension), benign   5. Type 2 diabetes mellitus with hyperlipidemia (HCC)   6. Mixed hyperlipidemia    PLAN:    Chronic systolic heart failure  - Heart function monitoring. Will repeat echo. Started to discuss on consideration for ICD if Ef does not improve , she is not interested at this time.  - Schedule echocardiogram for August. Clinically she is euvolemic.  Continue current dose of GDMT: entresto  49-51 mg bID, Aldactone  25 mg daily, Toprol  xl 50 mg daily  HLN - continue current dose of lipitor  DM - per primary team  Vitamin D deficiency Significant deficiency noted with level of 11 in March. Discussed commonality and potential contribution to fatigue. Emphasized repletion need. - Order repeat vitamin D level. - Prescribe higher doses of vitamin D if levels remain low.  The patient is in agreement with the above plan. The patient left the office in stable condition.  The patient will follow up in   Medication Adjustments/Labs and Tests Ordered: Current medicines are reviewed at length with the patient today.  Concerns regarding medicines are outlined above.  Orders Placed This Encounter  Procedures   Vitamin D 1,25 dihydroxy   ECHOCARDIOGRAM COMPLETE   No orders of the defined types were placed in this encounter.   Patient Instructions    Testing/Procedures:  Your physician has requested that you have an echocardiogram. Echocardiography is a painless test that uses sound waves to create images of your heart. It provides your doctor with information about the size and shape of your heart and how well your heart's chambers and valves are working. This  procedure takes approximately one hour. There are no restrictions for this procedure. Please do NOT wear cologne, perfume, aftershave, or lotions (deodorant is allowed). Please arrive 15 minutes prior to your appointment time.  Please note: We ask at that you not bring children with you during ultrasound (echo/ vascular) testing. Due to room size and safety concerns, children are not allowed in the ultrasound rooms during exams. Our front office staff cannot provide observation of children in our lobby area while testing is being conducted. An adult accompanying a patient to their appointment will only be allowed in the ultrasound room at the discretion of the ultrasound technician under special circumstances. We apologize for any inconvenience. [address]-SCH[address] IN AUGUST  Follow-Up: At [facility], you and your health needs are our priority.  As part of our continuing mission to provide you with exceptional heart care, our providers are all part of one team.  This team includes your primary Cardiologist (physician) and Advanced Practice Providers or APPs (Physician Assistants and Nurse Practitioners) who all work together to provide you with the care you need, when you need it.  Your next appointment:   6 month(s)  Provider:   Hina Gupta, DO          Adopting a Healthy Lifestyle.  Know what a healthy weight is for you (roughly BMI <25) and aim to maintain this   Aim for 7+ servings of fruits and vegetables daily   65-80+ fluid ounces of water or unsweet tea for healthy kidneys   Limit to max 1 drink of alcohol per day; avoid smoking/tobacco   Limit animal fats in diet for cholesterol and heart health - choose grass fed whenever available   Avoid highly processed foods, and foods high in saturated/trans fats   Aim for low stress - take time to unwind and care for your mental health   Aim for 150 min of moderate intensity exercise weekly for heart health, and  weights twice weekly for bone health   Aim for 7-9 hours of sleep daily   When it comes to diets, agreement about the perfect plan isnt easy to find, even among the experts. Experts at the [facility] [employer] developed an idea known as the Healthy Eating Plate. Just  imagine a plate divided into logical, healthy portions.   The emphasis is on diet quality:   Load up on vegetables and fruits - one-half of your plate: Aim for color and variety, and remember that potatoes dont count.   Go for whole grains - one-quarter of your plate: Whole wheat, barley, wheat berries, quinoa, oats, brown rice, and foods made with them. If you want pasta, go with whole wheat pasta.   Protein power - one-quarter of your plate: Fish, chicken, beans, and nuts are all healthy, versatile protein sources. Limit red meat.   The diet, however, does go beyond the plate, offering a few other suggestions.   Use healthy plant oils, such as olive, canola, soy, corn, sunflower and peanut. Check the labels, and avoid partially hydrogenated oil, which have unhealthy trans fats.   If youre thirsty, drink water. Coffee and tea are good in moderation, but skip sugary drinks and limit milk and dairy products to one or two daily servings.   The type of carbohydrate in the diet is more important than the amount. Some sources of carbohydrates, such as vegetables, fruits, whole grains, and beans-are healthier than others.   Finally, stay active  Signed, Salah Burlison, DO  05/02/2024 10:54 PM    [facility]

## 2024-05-03 LAB — VITAMIN D 1,25 DIHYDROXY
Vitamin D 1, 25 (OH)2 Total: 83 pg/mL — ABNORMAL HIGH
Vitamin D2 1, 25 (OH)2: <10 pg/mL
Vitamin D3 1, 25 (OH)2: 81 pg/mL

## 2024-05-05 ENCOUNTER — Ambulatory Visit: Payer: Self-pay | Admitting: Cardiology

## 2024-05-05 DIAGNOSIS — R899 Unspecified abnormal finding in specimens from other organs, systems and tissues: Secondary | ICD-10-CM

## 2024-06-02 ENCOUNTER — Other Ambulatory Visit: Payer: Self-pay

## 2024-06-02 DIAGNOSIS — R899 Unspecified abnormal finding in specimens from other organs, systems and tissues: Secondary | ICD-10-CM

## 2024-06-03 ENCOUNTER — Ambulatory Visit: Payer: Self-pay | Admitting: Cardiology

## 2024-06-03 LAB — VITAMIN D 25 HYDROXY (VIT D DEFICIENCY, FRACTURES): Vit D, 25-Hydroxy: 15.5 ng/mL — ABNORMAL LOW (ref 30.0–100.0)

## 2024-06-16 ENCOUNTER — Other Ambulatory Visit: Payer: Self-pay

## 2024-06-16 MED ORDER — VITAMIN D (ERGOCALCIFEROL) 1.25 MG (50000 UNIT) PO CAPS: 50000.0000 [IU] | ORAL_CAPSULE | ORAL | 0 refills | Status: DC

## 2024-06-16 NOTE — Progress Notes (Signed)
 Called pt to relay Dr. Ginette message. Prescription for Vit D sent to preferred pharmacy. No questions expressed at this time.

## 2024-07-01 ENCOUNTER — Ambulatory Visit (HOSPITAL_COMMUNITY)
Admission: RE | Admit: 2024-07-01 | Discharge: 2024-07-01 | Disposition: A | Discharge status: Home / Self Care | Source: Ambulatory Visit | Attending: Cardiology | Admitting: Cardiology

## 2024-07-01 DIAGNOSIS — I420 Dilated cardiomyopathy: Secondary | ICD-10-CM | POA: Insufficient documentation

## 2024-07-03 LAB — ECHOCARDIOGRAM COMPLETE
AR max vel: 1.36 cm2
AV Area VTI: 1.25 cm2
AV Area mean vel: 1.19 cm2
AV Mean grad: 3.9 mmHg
AV Peak grad: 7.2 mmHg
Ao pk vel: 1.34 m/s
Area-P 1/2: 3.36 cm2
MV M vel: 5.34 m/s
MV Peak grad: 113.9 mmHg
P 1/2 time: 449 ms
S' Lateral: 4.1 cm

## 2024-08-14 ENCOUNTER — Other Ambulatory Visit: Payer: Self-pay | Admitting: Cardiology

## 2024-11-04 ENCOUNTER — Encounter: Payer: Self-pay | Admitting: Cardiology

## 2024-11-04 ENCOUNTER — Ambulatory Visit: Attending: Cardiology | Admitting: Cardiology

## 2024-11-04 VITALS — BP 138/72 | HR 49 | Ht 59.0 in | Wt 131.8 lb

## 2024-11-04 DIAGNOSIS — I420 Dilated cardiomyopathy: Secondary | ICD-10-CM

## 2024-11-04 DIAGNOSIS — R42 Dizziness and giddiness: Secondary | ICD-10-CM

## 2024-11-04 DIAGNOSIS — Z79899 Other long term (current) drug therapy: Secondary | ICD-10-CM

## 2024-11-04 LAB — CBC

## 2024-11-04 MED ORDER — SPIRONOLACTONE 25 MG PO TABS: 12.5000 mg | ORAL_TABLET | Freq: Every day | ORAL | 3 refills | Status: AC

## 2024-11-04 NOTE — Progress Notes (Signed)
 Cardiology Office Note:    Date:  11/04/2024   ID:  Christina Green, DOB [birth year], MRN [id number]  PCP:  Alys Schuyler HERO, PA  Cardiologist:  Dub Huntsman, DO  Electrophysiologist:  None   Referring MD: Alys Schuyler HERO, PA    I am ok   History of Present Illness:    Christina Green is a 89 y.o. female with a hx of chronic systolic heart failure, Dilated cardiomyopathy with EF 25-30%, Mild to moderate AR,  benign adrenal adenoma, depression, Graves' disease, hypertension, hyperlipidemia, psoriasis.  Presents for follow-up visit.   She experiences fatigue and attributes it to her medication, metoprolol , which she feels may slow her down. But she tells me that she has been experiencing some dizziness. She reports that the dizziness is getting worse and interrupting her daily activities.    No chest pain, shortness of breath or leg swelling   Past Medical History:  Diagnosis Date   Adrenal adenoma    benign   Depression    takes Cymbalta  daily   Diarrhea    often takes Immodium as needed   DJD (degenerative joint disease) of knee    and hands   Graves disease    Headache    nightly   History of kidney stones    History of shingles    Hyperlipemia    takes Atorvastatin  daily   Hypertension    takes Hyzaar and Labetalol  daily   Impaired hearing    wears hearing aides   Joint pain    Joint swelling    Left bundle branch block (LBBB)    identified in 2011   Psoriasis     Past Surgical History:  Procedure Laterality Date   CARDIAC CATHETERIZATION     CATARACT EXTRACTION     CHOLECYSTECTOMY     colonscopy     KNEE ARTHROSCOPY W/ MENISCAL REPAIR Right    REPLACEMENT TOTAL KNEE Left    TOTAL KNEE ARTHROPLASTY Right 01/31/2016   Procedure: RIGHT TOTAL KNEE ARTHROPLASTY;  Surgeon: Norleen Gavel, MD;  Location: [facility] OR;  Service: Orthopedics;  Laterality: Right;    Current Medications: Current Meds  Medication Sig   aspirin  EC 81 MG tablet Take 81 mg by mouth  daily. Take 1 Tablet Daily. Swallow whole.   atorvastatin  (LIPITOR) 20 MG tablet Take 1 tablet (20 mg total) by mouth daily.   DULoxetine  (CYMBALTA ) 60 MG capsule Take 60 mg by mouth daily. For depression   furosemide  (LASIX ) 20 MG tablet Take 1 tablet (20 mg total) by mouth daily as needed for edema or fluid (take in case of weight gain 2 to 3 lbs in 24 hrs or 5 lbs in 7 days.).   Multiple Vitamins-Minerals (PRESERVISION AREDS PO) Take 1 capsule by mouth in the morning and at bedtime.   sacubitril -valsartan  (ENTRESTO ) 49-51 MG Take 1 tablet by mouth 2 (two) times daily.   spironolactone  (ALDACTONE ) 25 MG tablet Take 0.5 tablets (12.5 mg total) by mouth daily.   [DISCONTINUED] metoprolol  succinate (TOPROL -XL) 50 MG 24 hr tablet Take 1 tablet (50 mg total) by mouth daily. Take with or immediately following a meal.   [DISCONTINUED] spironolactone  (ALDACTONE ) 25 MG tablet Take 1 tablet (25 mg total) by mouth daily.     Allergies:   Penicillins   Social History   Socioeconomic History   Marital status: Widowed    Spouse name: Not on file   Number of children: Not on file   Years of education:  Not on file   Highest education level: Not on file  Occupational History   Not on file  Tobacco Use   Smoking status: Never   Smokeless tobacco: Not on file  Substance and Sexual Activity   Alcohol use: Yes    Comment: wine occasionally   Drug use: No   Sexual activity: Not on file  Other Topics Concern   Not on file  Social History Narrative   Not on file   Social Drivers of Health   Financial Resource Strain: Not on file  Food Insecurity: Not on file  Transportation Needs: Not on file  Physical Activity: Not on file  Stress: Not on file  Social Connections: Not on file     Family History: The patient's family history includes CVA in her sister; Heart disease in her father and mother; Hypertension in her father, mother, and sister.  ROS:   Review of Systems  Constitution: Negative  for decreased appetite, fever and weight gain.  HENT: Negative for congestion, ear discharge, hoarse voice and sore throat.   Eyes: Negative for discharge, redness, vision loss in right eye and visual halos.  Cardiovascular: Negative for chest pain, dyspnea on exertion, leg swelling, orthopnea and palpitations.  Respiratory: Negative for cough, hemoptysis, shortness of breath and snoring.   Endocrine: Negative for heat intolerance and polyphagia.  Hematologic/Lymphatic: Negative for bleeding problem. Does not bruise/bleed easily.  Skin: Negative for flushing, nail changes, rash and suspicious lesions.  Musculoskeletal: Negative for arthritis, joint pain, muscle cramps, myalgias, neck pain and stiffness.  Gastrointestinal: Negative for abdominal pain, bowel incontinence, diarrhea and excessive appetite.  Genitourinary: Negative for decreased libido, genital sores and incomplete emptying.  Neurological: Negative for brief paralysis, focal weakness, headaches and loss of balance.  Psychiatric/Behavioral: Negative for altered mental status, depression and suicidal ideas.  Allergic/Immunologic: Negative for HIV exposure and persistent infections.    EKGs/Labs/Other Studies Reviewed:    The following studies were reviewed today:   EKG:  The ekg ordered today demonstrates   Recent Labs: No results found for requested labs within last 365 days.  Recent Lipid Panel    Component Value Date/Time   CHOL 265 (H) 02/18/2023 0049   TRIG 228 (H) 02/18/2023 0049   HDL 44 02/18/2023 0049   CHOLHDL 6.0 02/18/2023 0049   VLDL 46 (H) 02/18/2023 0049   LDLCALC 175 (H) 02/18/2023 0049    Physical Exam:    VS:  BP 138/72 (BP Location: Left Arm, Patient Position: Sitting, Cuff Size: Normal)   Pulse (!) 49   Ht 4' 11 (1.499 m)   Wt 131 lb 12.8 oz (59.8 kg)   SpO2 98%   BMI 26.62 kg/m     Wt Readings from Last 3 Encounters:  11/04/24 131 lb 12.8 oz (59.8 kg)  04/23/24 131 lb 12.8 oz (59.8 kg)   12/27/23 140 lb (63.5 kg)     GEN: Well nourished, well developed in no acute distress HEENT: Normal NECK: No JVD; No carotid bruits LYMPHATICS: No lymphadenopathy CARDIAC: S1S2 noted,RRR, no murmurs, rubs, gallops RESPIRATORY:  Clear to auscultation without rales, wheezing or rhonchi  ABDOMEN: Soft, non-tender, non-distended, +bowel sounds, no guarding. EXTREMITIES: No edema, No cyanosis, no clubbing MUSCULOSKELETAL:  No deformity  SKIN: Warm and dry NEUROLOGIC:  Alert and oriented x 3, non-focal PSYCHIATRIC:  Normal affect, good insight  ASSESSMENT:    1. Medication management   2. Cardiomyopathy, dilated, nonischemic (HCC)   3. Dizziness     PLAN:  Dizziness-she is experiencing dizziness, she is bradycardic in office today.  What I will do is hold her beta-blocker for now and monitor her.  Also will cut back on Aldactone  to 12.5 mg daily.  Chronic systolic heart failure  - Heart function monitoring. Will repeat echo in January before she sees me. Started to discuss on consideration for ICD if Ef does not improve , she is not interested at this time.    Clinically she is euvolemic. Continue current dose of GDMT: entresto  49-51 mg bID, Aldactone  12.5 mg daily,   HLN - continue current dose of lipitor  DM - per primary team   The patient is in agreement with the above plan. The patient left the office in stable condition.  The patient will follow up in   Medication Adjustments/Labs and Tests Ordered: Current medicines are reviewed at length with the patient today.  Concerns regarding medicines are outlined above.  Orders Placed This Encounter  Procedures   Comp Met (CMET)   Magnesium    CBC   ECHOCARDIOGRAM COMPLETE   Meds ordered this encounter  Medications   spironolactone  (ALDACTONE ) 25 MG tablet    Sig: Take 0.5 tablets (12.5 mg total) by mouth daily.    Dispense:  45 tablet    Refill:  3    Patient Instructions  Medication Instructions:  Your  physician has recommended you make the following change in your medication:  STOP: Metoprolol  DECREASE: Aldactone  12.5 mg once daily  *If you need a refill on your cardiac medications before your next appointment, please call your pharmacy*  Lab Work: CMET, Mag, CBC If you have labs (blood work) drawn today and your tests are completely normal, you will receive your results only by: MyChart Message (if you have MyChart) OR A paper copy in the mail If you have any lab test that is abnormal or we need to change your treatment, we will call you to review the results.  Testing/Procedure: Your physician has requested that you have an echocardiogram before appointment. Echocardiography is a painless test that uses sound waves to create images of your heart. It provides your doctor with information about the size and shape of your heart and how well your heart's chambers and valves are working. This procedure takes approximately one hour. There are no restrictions for this procedure. Please do NOT wear cologne, perfume, aftershave, or lotions (deodorant is allowed). Please arrive 15 minutes prior to your appointment time.  Please note: We ask at that you not bring children with you during ultrasound (echo/ vascular) testing. Due to room size and safety concerns, children are not allowed in the ultrasound rooms during exams. Our front office staff cannot provide observation of children in our lobby area while testing is being conducted. An adult accompanying a patient to their appointment will only be allowed in the ultrasound room at the discretion of the ultrasound technician under special circumstances. We apologize for any inconvenience.   Follow-Up: At [facility], you and your health needs are our priority.  As part of our continuing mission to provide you with exceptional heart care, our providers are all part of one team.  This team includes your primary Cardiologist (physician) and  Advanced Practice Providers or APPs (Physician Assistants and Nurse Practitioners) who all work together to provide you with the care you need, when you need it.  Your next appointment:      Provider:   Jamarious Febo, DO     Adopting a Healthy  Lifestyle.  Know what a healthy weight is for you (roughly BMI <25) and aim to maintain this   Aim for 7+ servings of fruits and vegetables daily   65-80+ fluid ounces of water or unsweet tea for healthy kidneys   Limit to max 1 drink of alcohol per day; avoid smoking/tobacco   Limit animal fats in diet for cholesterol and heart health - choose grass fed whenever available   Avoid highly processed foods, and foods high in saturated/trans fats   Aim for low stress - take time to unwind and care for your mental health   Aim for 150 min of moderate intensity exercise weekly for heart health, and weights twice weekly for bone health   Aim for 7-9 hours of sleep daily   When it comes to diets, agreement about the perfect plan isnt easy to find, even among the experts. Experts at the [facility] [employer] developed an idea known as the Healthy Eating Plate. Just imagine a plate divided into logical, healthy portions.   The emphasis is on diet quality:   Load up on vegetables and fruits - one-half of your plate: Aim for color and variety, and remember that potatoes dont count.   Go for whole grains - one-quarter of your plate: Whole wheat, barley, wheat berries, quinoa, oats, brown rice, and foods made with them. If you want pasta, go with whole wheat pasta.   Protein power - one-quarter of your plate: Fish, chicken, beans, and nuts are all healthy, versatile protein sources. Limit red meat.   The diet, however, does go beyond the plate, offering a few other suggestions.   Use healthy plant oils, such as olive, canola, soy, corn, sunflower and peanut. Check the labels, and avoid partially hydrogenated oil, which have unhealthy trans  fats.   If youre thirsty, drink water. Coffee and tea are good in moderation, but skip sugary drinks and limit milk and dairy products to one or two daily servings.   The type of carbohydrate in the diet is more important than the amount. Some sources of carbohydrates, such as vegetables, fruits, whole grains, and beans-are healthier than others.   Finally, stay active  Signed, Ikhlas Albo, DO  11/04/2024 10:00 PM    [facility]

## 2024-11-04 NOTE — Patient Instructions (Addendum)
 Medication Instructions:  Your physician has recommended you make the following change in your medication:  STOP: Metoprolol  DECREASE: Aldactone  12.5 mg once daily  *If you need a refill on your cardiac medications before your next appointment, please call your pharmacy*  Lab Work: CMET, Mag, CBC If you have labs (blood work) drawn today and your tests are completely normal, you will receive your results only by: MyChart Message (if you have MyChart) OR A paper copy in the mail If you have any lab test that is abnormal or we need to change your treatment, we will call you to review the results.  Testing/Procedure: Your physician has requested that you have an echocardiogram before appointment. Echocardiography is a painless test that uses sound waves to create images of your heart. It provides your doctor with information about the size and shape of your heart and how well your heart's chambers and valves are working. This procedure takes approximately one hour. There are no restrictions for this procedure. Please do NOT wear cologne, perfume, aftershave, or lotions (deodorant is allowed). Please arrive 15 minutes prior to your appointment time.  Please note: We ask at that you not bring children with you during ultrasound (echo/ vascular) testing. Due to room size and safety concerns, children are not allowed in the ultrasound rooms during exams. Our front office staff cannot provide observation of children in our lobby area while testing is being conducted. An adult accompanying a patient to their appointment will only be allowed in the ultrasound room at the discretion of the ultrasound technician under special circumstances. We apologize for any inconvenience.   Follow-Up: At [facility], you and your health needs are our priority.  As part of our continuing mission to provide you with exceptional heart care, our providers are all part of one team.  This team includes your primary  Cardiologist (physician) and Advanced Practice Providers or APPs (Physician Assistants and Nurse Practitioners) who all work together to provide you with the care you need, when you need it.  Your next appointment:      Provider:   Kardie Tobb, DO

## 2024-11-05 ENCOUNTER — Ambulatory Visit: Payer: Self-pay | Admitting: Cardiology

## 2024-11-05 ENCOUNTER — Other Ambulatory Visit: Payer: Self-pay

## 2024-11-05 ENCOUNTER — Ambulatory Visit: Admitting: Cardiology

## 2024-11-05 LAB — COMPREHENSIVE METABOLIC PANEL WITH GFR
ALT: 9 IU/L (ref 0–32)
AST: 15 IU/L (ref 0–40)
Albumin: 4.6 g/dL (ref 3.6–4.6)
Alkaline Phosphatase: 93 IU/L (ref 48–129)
BUN/Creatinine Ratio: 22 (ref 12–28)
BUN: 22 mg/dL (ref 10–36)
Bilirubin Total: 0.6 mg/dL (ref 0.0–1.2)
CO2: 23 mmol/L (ref 20–29)
Calcium: 9.2 mg/dL (ref 8.7–10.3)
Chloride: 98 mmol/L (ref 96–106)
Creatinine, Ser: 0.98 mg/dL (ref 0.57–1.00)
Globulin, Total: 1.8 g/dL (ref 1.5–4.5)
Glucose: 122 mg/dL — ABNORMAL HIGH (ref 70–99)
Potassium: 4.2 mmol/L (ref 3.5–5.2)
Sodium: 140 mmol/L (ref 134–144)
Total Protein: 6.4 g/dL (ref 6.0–8.5)
eGFR: 53 mL/min/1.73 — AB (ref >59)

## 2024-11-05 LAB — CBC
Hematocrit: 40.3 % (ref 34.0–46.6)
Hemoglobin: 13.5 g/dL (ref 11.1–15.9)
MCH: 32.2 pg (ref 26.6–33.0)
MCHC: 33.5 g/dL (ref 31.5–35.7)
MCV: 96 fL (ref 79–97)
Platelets: 317 x10E3/uL (ref 150–450)
RBC: 4.19 x10E6/uL (ref 3.77–5.28)
RDW: 12.1 % (ref 11.7–15.4)
WBC: 9.7 x10E3/uL (ref 3.4–10.8)

## 2024-11-05 LAB — MAGNESIUM: Magnesium: 2 mg/dL (ref 1.6–2.3)

## 2024-11-07 MED ORDER — SACUBITRIL-VALSARTAN 49-51 MG PO TABS: 1.0000 | ORAL_TABLET | Freq: Two times a day (BID) | ORAL | 3 refills | Status: AC

## 2024-11-10 ENCOUNTER — Telehealth: Payer: Self-pay | Admitting: Cardiology

## 2024-11-10 NOTE — Telephone Encounter (Signed)
 Pt c/o medication issue:  1. Name of Medication:   sacubitril -valsartan  (ENTRESTO ) 49-51 MG  spironolactone  (ALDACTONE ) 25 MG tablet   2. How are you currently taking this medication (dosage and times per day)? As written   3. Are you having a reaction (difficulty breathing--STAT)? no  4. What is your medication issue? Pt states insurance no longer covers her Entresto . Also taking Aldactone  and it is not helping her dizziness.

## 2024-11-10 NOTE — Telephone Encounter (Signed)
 STAT if patient feels like he/she is going to faint   1. Are you feeling dizzy, lightheaded, or faint right now? Faint     2. Have you passed out?  No  (If yes move to .[facility]NC[facility])   3. Do you have any other symptoms? no   4. Have you checked your HR and BP (record if available)? No    Transferred to triage.

## 2024-11-10 NOTE — Telephone Encounter (Signed)
 Patient identification verified by 2 forms.   Called and spoke to patient  Patient states:  -She is still dizzy after cutting spiro in half as recommended.  -Completely out of Entresto .  -Pt states I am scared to stand.   Patient denies:  -Pt does not see Metoprolol  in her medications.      Interventions/Plan: -Pt will look over medications and stop taking Metoprolol  as instructed.  -Will reach out [facility] to inquire about Entresto  refill.  Reviewed ED warning signs/precautions  Patient agrees with plan, no questions at this time

## 2024-11-10 NOTE — Telephone Encounter (Signed)
 Spoke with pt. See other encounter.

## 2024-11-13 NOTE — Telephone Encounter (Signed)
 Results/message from Dr. Emmette Harms has been released to MyChart. A letter is being sent to the last known home address.

## 2024-11-28 ENCOUNTER — Encounter: Payer: Self-pay | Admitting: Cardiology

## 2024-12-10 ENCOUNTER — Encounter: Payer: Self-pay | Admitting: Cardiology

## 2024-12-10 ENCOUNTER — Ambulatory Visit: Attending: Cardiology | Admitting: Cardiology

## 2024-12-10 VITALS — BP 98/50 | HR 84 | Ht 59.0 in | Wt 120.6 lb

## 2024-12-10 DIAGNOSIS — J9601 Acute respiratory failure with hypoxia: Secondary | ICD-10-CM

## 2024-12-10 DIAGNOSIS — I10 Essential (primary) hypertension: Secondary | ICD-10-CM | POA: Diagnosis not present

## 2024-12-10 DIAGNOSIS — I5022 Chronic systolic (congestive) heart failure: Secondary | ICD-10-CM | POA: Diagnosis not present

## 2024-12-10 DIAGNOSIS — R42 Dizziness and giddiness: Secondary | ICD-10-CM

## 2024-12-10 DIAGNOSIS — R0602 Shortness of breath: Secondary | ICD-10-CM

## 2024-12-10 LAB — LAB REPORT - SCANNED: EGFR: 64

## 2024-12-10 NOTE — Patient Instructions (Signed)
 Medication Instructions:  Your physician recommends that you continue on your current medications as directed. Please refer to the Current Medication list given to you today.  *If you need a refill on your cardiac medications before your next appointment, please call your pharmacy*   Follow-Up: At [facility], you and your health needs are our priority.  As part of our continuing mission to provide you with exceptional heart care, our providers are all part of one team.  This team includes your primary Cardiologist (physician) and Advanced Practice Providers or APPs (Physician Assistants and Nurse Practitioners) who all work together to provide you with the care you need, when you need it.  Your next appointment:   16 week(s)  Provider:   Kardie Tobb, DO     Other Instructions:

## 2024-12-12 NOTE — Progress Notes (Signed)
 " Cardiology Office Note:    Date:  12/12/2024   ID:  Christina Green, DOB June 11, 1927, MRN [id number]  PCP:  Alys Schuyler HERO, PA  Cardiologist:  Dub Huntsman, DO  Electrophysiologist:  None   Referring MD: Alys Schuyler HERO, PA    I am ok   History of Present Illness:    Christina Green is a 89 y.o. female with a hx of chronic systolic heart failure, Dilated cardiomyopathy with EF 25-30%, Mild to moderate AR,  benign adrenal adenoma, depression, Graves' disease, hypertension, hyperlipidemia, psoriasis.  Presents for follow-up visit.   During her last visit she was dizzy, I stopped her beta blocker and decreased aldactone  to 12.5 mg daily.   Since her last visit with me she was in [place]  for her holiday trip and diagnosed with the flu and Pneumonia. She is on oxygen currently.  She is still experiencing shortness of breath.  Past Medical History:  Diagnosis Date   Adrenal adenoma    benign   Depression    takes Cymbalta  daily   Diarrhea    often takes Immodium as needed   DJD (degenerative joint disease) of knee    and hands   Graves disease    Headache    nightly   History of kidney stones    History of shingles    Hyperlipemia    takes Atorvastatin  daily   Hypertension    takes Hyzaar and Labetalol  daily   Impaired hearing    wears hearing aides   Joint pain    Joint swelling    Left bundle branch block (LBBB)    identified in 2011   Psoriasis     Past Surgical History:  Procedure Laterality Date   CARDIAC CATHETERIZATION     CATARACT EXTRACTION     CHOLECYSTECTOMY     colonscopy     KNEE ARTHROSCOPY W/ MENISCAL REPAIR Right    REPLACEMENT TOTAL KNEE Left    TOTAL KNEE ARTHROPLASTY Right 01/31/2016   Procedure: RIGHT TOTAL KNEE ARTHROPLASTY;  Surgeon: Norleen Gavel, MD;  Location: [facility] OR;  Service: Orthopedics;  Laterality: Right;    Current Medications: Current Meds  Medication Sig   atorvastatin  (LIPITOR) 20 MG tablet Take 1 tablet (20 mg total)  by mouth daily.   cefUROXime (CEFTIN) 500 MG tablet Take 500 mg by mouth 2 (two) times daily with a meal.   doxycycline  (VIBRAMYCIN ) 100 MG capsule Take 1 capsule by mouth daily.   DULoxetine  (CYMBALTA ) 60 MG capsule Take 60 mg by mouth daily. For depression   furosemide  (LASIX ) 20 MG tablet Take 1 tablet (20 mg total) by mouth daily as needed for edema or fluid (take in case of weight gain 2 to 3 lbs in 24 hrs or 5 lbs in 7 days.).   JARDIANCE 10 MG TABS tablet Take 10 mg by mouth daily.   meclizine  (ANTIVERT ) 12.5 MG tablet Take 12.5 mg by mouth daily as needed.   metoprolol  succinate (TOPROL -XL) 25 MG 24 hr tablet Take 12.5 mg by mouth daily.   Multiple Vitamins-Minerals (PRESERVISION AREDS PO) Take 1 capsule by mouth in the morning and at bedtime.   potassium chloride  (KLOR-CON ) 8 MEQ tablet Take 8 mEq by mouth daily.   sacubitril -valsartan  (ENTRESTO ) 49-51 MG Take 1 tablet by mouth 2 (two) times daily.   spironolactone  (ALDACTONE ) 25 MG tablet Take 0.5 tablets (12.5 mg total) by mouth daily.     Allergies:   Penicillins   Social History  Socioeconomic History   Marital status: Widowed    Spouse name: Not on file   Number of children: Not on file   Years of education: Not on file   Highest education level: Not on file  Occupational History   Not on file  Tobacco Use   Smoking status: Never   Smokeless tobacco: Not on file  Substance and Sexual Activity   Alcohol use: Yes    Comment: wine occasionally   Drug use: No   Sexual activity: Not on file  Other Topics Concern   Not on file  Social History Narrative   Not on file   Social Drivers of Health   Tobacco Use: Unknown (12/10/2024)   Patient History    Smoking Tobacco Use: Never    Smokeless Tobacco Use: Unknown    Passive Exposure: Not on file  Financial Resource Strain: Not on file  Food Insecurity: Not on file  Transportation Needs: Not on file  Physical Activity: Not on file  Stress: Not on file  Social  Connections: Not on file  Depression (EYV7-0): Not on file  Alcohol Screen: Not on file  Housing: Not on file  Utilities: Not on file  Health Literacy: Not on file     Family History: The patient's family history includes CVA in her sister; Heart disease in her father and mother; Hypertension in her father, mother, and sister.  ROS:   Review of Systems  Constitution: Negative for decreased appetite, fever and weight gain.  HENT: Negative for congestion, ear discharge, hoarse voice and sore throat.   Eyes: Negative for discharge, redness, vision loss in right eye and visual halos.  Cardiovascular: Negative for chest pain, dyspnea on exertion, leg swelling, orthopnea and palpitations.  Respiratory: Negative for cough, hemoptysis, shortness of breath and snoring.   Endocrine: Negative for heat intolerance and polyphagia.  Hematologic/Lymphatic: Negative for bleeding problem. Does not bruise/bleed easily.  Skin: Negative for flushing, nail changes, rash and suspicious lesions.  Musculoskeletal: Negative for arthritis, joint pain, muscle cramps, myalgias, neck pain and stiffness.  Gastrointestinal: Negative for abdominal pain, bowel incontinence, diarrhea and excessive appetite.  Genitourinary: Negative for decreased libido, genital sores and incomplete emptying.  Neurological: Negative for brief paralysis, focal weakness, headaches and loss of balance.  Psychiatric/Behavioral: Negative for altered mental status, depression and suicidal ideas.  Allergic/Immunologic: Negative for HIV exposure and persistent infections.    EKGs/Labs/Other Studies Reviewed:    The following studies were reviewed today:   EKG:  The ekg ordered today demonstrates   Recent Labs: 11/04/2024: ALT 9; BUN 22; Creatinine, Ser 0.98; Hemoglobin 13.5; Magnesium  2.0; Platelets 317; Potassium 4.2; Sodium 140  Recent Lipid Panel    Component Value Date/Time   CHOL 265 (H) 02/18/2023 0049   TRIG 228 (H) 02/18/2023  0049   HDL 44 02/18/2023 0049   CHOLHDL 6.0 02/18/2023 0049   VLDL 46 (H) 02/18/2023 0049   LDLCALC 175 (H) 02/18/2023 0049    Physical Exam:    VS:  BP (!) 98/50 (BP Location: Right Arm, Patient Position: Sitting, Cuff Size: Normal)   Pulse 84   Ht 4' 11 (1.499 m)   Wt 120 lb 9.6 oz (54.7 kg)   SpO2 91%   BMI 24.36 kg/m     Wt Readings from Last 3 Encounters:  12/10/24 120 lb 9.6 oz (54.7 kg)  11/04/24 131 lb 12.8 oz (59.8 kg)  04/23/24 131 lb 12.8 oz (59.8 kg)     GEN: Well nourished, well  developed in no acute distress HEENT: Normal NECK: No JVD; No carotid bruits LYMPHATICS: No lymphadenopathy CARDIAC: S1S2 noted,RRR, no murmurs, rubs, gallops RESPIRATORY:  Clear to auscultation without rales, wheezing or rhonchi  ABDOMEN: Soft, non-tender, non-distended, +bowel sounds, no guarding. EXTREMITIES: No edema, No cyanosis, no clubbing MUSCULOSKELETAL:  No deformity  SKIN: Warm and dry NEUROLOGIC:  Alert and oriented x 3, non-focal PSYCHIATRIC:  Normal affect, good insight  ASSESSMENT:    1. HTN (hypertension), benign   2. Shortness of breath   3. Dizziness   4. Acute respiratory failure with hypoxia (HCC)   5. Chronic systolic CHF (congestive heart failure) (HCC)     PLAN:    Dizziness - this has resolved. Will continue to hold her beta blocker. And keep the aldactone  at the current dose.   Hypoxic respiratory failure - on home oxygen since her flu and pneumonia. Managed by her pcp.   Chronic systolic heart failure  - Heart function monitoring. Echo pending. Started to discuss on consideration for ICD if Ef does not improve , she is not interested at this time. CXR with small pleural effusion.  Clinically she is euvolemic. Continue current dose of GDMT: entresto  49-51 mg bID, Aldactone  12.5 mg daily,   HLN - continue current dose of lipitor  DM - per primary team   The patient is in agreement with the above plan. The patient left the office in stable  condition.  The patient will follow up in   Medication Adjustments/Labs and Tests Ordered: Current medicines are reviewed at length with the patient today.  Concerns regarding medicines are outlined above.  Orders Placed This Encounter  Procedures   EKG 12-Lead   No orders of the defined types were placed in this encounter.   Patient Instructions  Medication Instructions:  Your physician recommends that you continue on your current medications as directed. Please refer to the Current Medication list given to you today.  *If you need a refill on your cardiac medications before your next appointment, please call your pharmacy*   Follow-Up: At [facility], you and your health needs are our priority.  As part of our continuing mission to provide you with exceptional heart care, our providers are all part of one team.  This team includes your primary Cardiologist (physician) and Advanced Practice Providers or APPs (Physician Assistants and Nurse Practitioners) who all work together to provide you with the care you need, when you need it.  Your next appointment:   16 week(s)  Provider:   Tocara Mennen, DO     Other Instructions:            Adopting a Healthy Lifestyle.  Know what a healthy weight is for you (roughly BMI <25) and aim to maintain this   Aim for 7+ servings of fruits and vegetables daily   65-80+ fluid ounces of water or unsweet tea for healthy kidneys   Limit to max 1 drink of alcohol per day; avoid smoking/tobacco   Limit animal fats in diet for cholesterol and heart health - choose grass fed whenever available   Avoid highly processed foods, and foods high in saturated/trans fats   Aim for low stress - take time to unwind and care for your mental health   Aim for 150 min of moderate intensity exercise weekly for heart health, and weights twice weekly for bone health   Aim for 7-9 hours of sleep daily   When it comes to diets, agreement about  the perfect plan isnt easy to find, even among the experts. Experts at the [facility] [employer] developed an idea known as the Healthy Eating Plate. Just imagine a plate divided into logical, healthy portions.   The emphasis is on diet quality:   Load up on vegetables and fruits - one-half of your plate: Aim for color and variety, and remember that potatoes dont count.   Go for whole grains - one-quarter of your plate: Whole wheat, barley, wheat berries, quinoa, oats, brown rice, and foods made with them. If you want pasta, go with whole wheat pasta.   Protein power - one-quarter of your plate: Fish, chicken, beans, and nuts are all healthy, versatile protein sources. Limit red meat.   The diet, however, does go beyond the plate, offering a few other suggestions.   Use healthy plant oils, such as olive, canola, soy, corn, sunflower and peanut. Check the labels, and avoid partially hydrogenated oil, which have unhealthy trans fats.   If youre thirsty, drink water. Coffee and tea are good in moderation, but skip sugary drinks and limit milk and dairy products to one or two daily servings.   The type of carbohydrate in the diet is more important than the amount. Some sources of carbohydrates, such as vegetables, fruits, whole grains, and beans-are healthier than others.   Finally, stay active  Signed, Osborne Serio, DO  12/12/2024 10:53 PM    [facility] "

## 2024-12-16 ENCOUNTER — Ambulatory Visit (HOSPITAL_COMMUNITY)
Admission: RE | Admit: 2024-12-16 | Discharge: 2024-12-16 | Disposition: A | Discharge status: Home / Self Care | Source: Ambulatory Visit | Attending: Cardiology | Admitting: Cardiology

## 2024-12-16 DIAGNOSIS — I420 Dilated cardiomyopathy: Secondary | ICD-10-CM | POA: Diagnosis not present

## 2024-12-17 LAB — ECHOCARDIOGRAM COMPLETE
Area-P 1/2: 4.31 cm2
MV M vel: 5.55 m/s
MV Peak grad: 123.2 mmHg
P 1/2 time: 642 ms
S' Lateral: 3.2 cm

## 2024-12-30 ENCOUNTER — Telehealth: Payer: Self-pay | Admitting: Cardiology

## 2024-12-30 NOTE — Telephone Encounter (Signed)
 Pt c/o medication issue:  1. Name of Medication:  All medications  2. How are you currently taking this medication (dosage and times per day)?   3. Are you having a reaction (difficulty breathing--STAT)?   4. What is your medication issue?   Son Carolan) stated patient recently had the below medications refilled but wants to know if patient should continue on all of these medications  JARDIANCE 10 MG TABS tablet  metoprolol  succinate (TOPROL -XL) 25 MG 24 hr tablet  potassium chloride  (KLOR-CON ) 8 MEQ tablet  spironolactone  (ALDACTONE ) 25 MG tablet

## 2025-03-31 ENCOUNTER — Ambulatory Visit: Admitting: Cardiology
